# Patient Record
Sex: Male | Born: 1960 | Race: White | Hispanic: No | Marital: Married | Smoking: Current every day smoker
Health system: Southern US, Community
[De-identification: ages and names within clinical notes are randomized; demographics above are authoritative.]

## PROBLEM LIST (undated history)

## (undated) DIAGNOSIS — Z6836 Body mass index (BMI) 36.0-36.9, adult: Secondary | ICD-10-CM

## (undated) DIAGNOSIS — L930 Discoid lupus erythematosus: Secondary | ICD-10-CM

## (undated) DIAGNOSIS — I493 Ventricular premature depolarization: Secondary | ICD-10-CM

## (undated) DIAGNOSIS — G4700 Insomnia, unspecified: Secondary | ICD-10-CM

## (undated) DIAGNOSIS — F419 Anxiety disorder, unspecified: Secondary | ICD-10-CM

## (undated) DIAGNOSIS — M109 Gout, unspecified: Secondary | ICD-10-CM

## (undated) DIAGNOSIS — M1990 Unspecified osteoarthritis, unspecified site: Secondary | ICD-10-CM

## (undated) DIAGNOSIS — E039 Hypothyroidism, unspecified: Secondary | ICD-10-CM

## (undated) DIAGNOSIS — Z87891 Personal history of nicotine dependence: Secondary | ICD-10-CM

## (undated) DIAGNOSIS — G4730 Sleep apnea, unspecified: Secondary | ICD-10-CM

## (undated) DIAGNOSIS — I10 Essential (primary) hypertension: Secondary | ICD-10-CM

## (undated) DIAGNOSIS — E7800 Pure hypercholesterolemia, unspecified: Secondary | ICD-10-CM

## (undated) DIAGNOSIS — R51 Headache: Secondary | ICD-10-CM

## (undated) DIAGNOSIS — E785 Hyperlipidemia, unspecified: Secondary | ICD-10-CM

## (undated) DIAGNOSIS — F329 Major depressive disorder, single episode, unspecified: Secondary | ICD-10-CM

## (undated) DIAGNOSIS — R0602 Shortness of breath: Secondary | ICD-10-CM

## (undated) DIAGNOSIS — E669 Obesity, unspecified: Secondary | ICD-10-CM

## (undated) DIAGNOSIS — R002 Palpitations: Secondary | ICD-10-CM

## (undated) DIAGNOSIS — M539 Dorsopathy, unspecified: Secondary | ICD-10-CM

## (undated) DIAGNOSIS — F32A Depression, unspecified: Secondary | ICD-10-CM

## (undated) DIAGNOSIS — R739 Hyperglycemia, unspecified: Secondary | ICD-10-CM

## (undated) DIAGNOSIS — F418 Other specified anxiety disorders: Secondary | ICD-10-CM

## (undated) DIAGNOSIS — K219 Gastro-esophageal reflux disease without esophagitis: Secondary | ICD-10-CM

## (undated) HISTORY — DX: Personal history of nicotine dependence: Z87.891

## (undated) HISTORY — PX: BRAIN SURGERY: SHX531

## (undated) HISTORY — DX: Hypothyroidism, unspecified: E03.9

## (undated) HISTORY — DX: Dorsopathy, unspecified: M53.9

## (undated) HISTORY — DX: Palpitations: R00.2

## (undated) HISTORY — DX: Other specified anxiety disorders: F41.8

## (undated) HISTORY — DX: Hyperglycemia, unspecified: R73.9

## (undated) HISTORY — DX: Gout, unspecified: M10.9

## (undated) HISTORY — DX: Ventricular premature depolarization: I49.3

## (undated) HISTORY — DX: Hyperlipidemia, unspecified: E78.5

## (undated) HISTORY — DX: Essential (primary) hypertension: I10

## (undated) HISTORY — DX: Discoid lupus erythematosus: L93.0

## (undated) HISTORY — DX: Obesity, unspecified: E66.9

## (undated) HISTORY — PX: BACK SURGERY: SHX140

## (undated) HISTORY — DX: Body mass index (BMI) 36.0-36.9, adult: Z68.36

## (undated) HISTORY — DX: Insomnia, unspecified: G47.00

## (undated) HISTORY — PX: APPENDECTOMY: SHX54

## (undated) HISTORY — PX: NASAL SINUS SURGERY: SHX719

---

## 2001-01-31 ENCOUNTER — Encounter: Payer: Self-pay | Admitting: Neurosurgery

## 2001-02-01 ENCOUNTER — Inpatient Hospital Stay (HOSPITAL_COMMUNITY): Admission: RE | Admit: 2001-02-01 | Discharge: 2001-02-02 | Payer: Self-pay | Admitting: Neurosurgery

## 2001-02-02 ENCOUNTER — Encounter: Payer: Self-pay | Admitting: Neurosurgery

## 2001-04-25 HISTORY — PX: ANKLE ARTHROSCOPY: SHX545

## 2001-04-25 HISTORY — PX: INGUINAL HERNIA REPAIR: SHX194

## 2007-01-05 ENCOUNTER — Encounter: Admission: RE | Admit: 2007-01-05 | Discharge: 2007-01-05 | Payer: Self-pay | Admitting: Neurosurgery

## 2010-09-10 NOTE — Discharge Summary (Signed)
[place]. [facility]  Patient:    SEIF, TEICHERT Visit Number: [id number] MRN: [id number]          Service Type: MED Location: 3000 3034 01 Attending Physician:  Danella Penton Dictated by:   Tanya Nones. Jeral Fruit, M.D. Admit Date:  01/31/2001 Discharge Date: 02/02/2001                             Discharge Summary  ADMISSION DIAGNOSIS:  L5-S1 herniated disk with a foot drop.  FINAL DIAGNOSIS:  L5-S1 herniated disk with a foot drop.  HISTORY OF PRESENT ILLNESS:  The patient was admitted because of foot drop. The patient had a previous surgery in [place], [place].  MRI shows recurrent disk at the level of L4-5.  Surgery was advised.  LABORATORY DATA:  Normal.  HOSPITAL COURSE:  The patient was taken to surgery, and underwent a right L5 hemilaminectomy with a L4-5 diskectomy and foraminotomy at L5-S1 was done. Today he is doing great, and he is ready to go home.  The patient has a shadow in the x-ray of the chest, but a CT scan of the chest was negative.  CONDITION ON DISCHARGE:  Improved.  DISCHARGE MEDICATIONS: 1. Percocet. 2. Diazepam.  DIET:  Regular.  ACTIVITY:  No to drive for at least a week.  FOLLOWUP:  To be seen by me next week. Dictated by:   Tanya Nones. Jeral Fruit, M.D. Attending Physician:  Danella Penton DD:  02/02/01 TD:  02/02/01 Job: [id number]

## 2010-09-10 NOTE — H&P (Signed)
[place]. [facility]  Patient:    CANIO, WINOKUR Visit Number: [id number] MRN: [id number]          Service Type: DSU Location: 3000 3034 01 Attending Physician:  Danella Penton Dictated by:   Tanya Nones. Jeral Fruit, M.D. Admit Date:  01/31/2001                           History and Physical  CHIEF COMPLAINT: Mr. Siebenaler is a patient who was seen by me in my office complaining of back and right leg pain for four weeks.  HISTORY OF PRESENT ILLNESS: Back in 1996 and later on in 1998 he underwent L4-5 and L5-S1 diskectomy on the right side by a neurosurgeon elsewhere.  Now he feels the pain is getting worse and he has more weakness.  He had been taking medication but despite that he is not any better.  He denies any problem with bladder or bowel.  There is no pain into the left leg but sometimes he complains of pain down into the left hip.  Although he lives far away from [place], he was sent to [place] because the neurosurgeon who did his case before was busy and was unable to see him.  He feels he is getting worse and he cannot wait for their appointment.  PAST MEDICAL HISTORY:  1. Lumbar diskectomy in 1996 and 1997.  2. CSF leak corrected in 1998.  3. Sinus surgery.  FAMILY HISTORY: Mother 24 years old with high blood pressure and diabetes. Father died at age 67 with stroke.  SOCIAL HISTORY: He smokes a pack a day.  He does not drink.  REVIEW OF SYSTEMS: Unable to smell.  He has high blood pressure.  He has pain, nonsciatic.  PHYSICAL EXAMINATION:  HEENT: Normal.  NECK: Normal.  LUNGS: Clear.  HEART: Sounds normal.  ABDOMEN: Normal.  EXTREMITIES: Normal pulses.  NEUROLOGIC: Mental status normal.  Cranial nerves normal except for having difficulty smelling.  Strength 5/5 except I can find weakness in the right foot 1/5 and plantar flexion 3/5.  There is no atrophy.  Sensation shows decrease to pinprick on the right side at the level of  L5-S1.  Reflexes are symmetric, absent at right quadriceps and right ankle jerk. Straight-leg-raise on the right side is positive 30 degrees, left side positive at 80 degrees.  LABORATORY DATA: MRI shows he has recurrent herniated disk at the level of L4-5 with ______ and also the possibility of herniated disk at the level of L5-S1.  CLINICAL IMPRESSION: Right L4-5 and L5-S1 herniated disk, recurrent.  PLAN: The patient wants to go ahead with surgery.  The surgery was fully explained to him and it will be a right L5 hemilaminectomy with L4-5 diskectomy and investigation of the 51.  The risks, of course, are no improvement whatsoever, worsening of pain, paralysis, need for further surgery which might require lumbar fusion. Dictated by:   Tanya Nones. Jeral Fruit, M.D. Attending Physician:  Danella Penton DD:  01/31/01 TD:  02/01/01 Job: [phone] [id number]

## 2010-09-10 NOTE — Op Note (Signed)
[place]. [facility]  Patient:    Gordon Roy, Gordon Roy Visit Number: [id number] MRN: [id number]          Service Type: DSU Location: 3000 3034 01 Attending Physician:  Danella Penton Dictated by:   Tanya Nones. Jeral Fruit, M.D. Proc. Date: 01/31/01 Admit Date:  01/31/2001                             Operative Report  PREOPERATIVE DIAGNOSES: 1. Right L4-5 herniated disk, recurrent, with almost foot drop. 2. Right L5-S1 stenosis, rule out herniated disk.  POSTOPERATIVE DIAGNOSES: 1. Right L4-5 herniated disk, recurrent, with almost foot drop. 2. Right L5-S1 stenosis, rule out herniated disk.  PROCEDURES: 1. Right L5 hemilaminectomy. 2. Right L4-5 diskectomy, foraminotomy, with decompression of the L5 nerve    foot. 3. Right L5-S1 foraminotomy, lysis of adhesions. 4. Microscope.  SURGEON:  Tanya Nones. Jeral Fruit, M.D.  CLINICAL HISTORY:  Gordon Roy is a 50 year old gentleman who underwent surgery twice by a neurosurgeon in [place], [place].  In the past few days, he has been complaining of worsening pain associated with almost foot drop in the right foot.  I saw him yesterday and it was decided that he wanted to go ahead with surgery as soon as possible.  The risks were explained in the history and physical.  DESCRIPTION OF PROCEDURE:  The patient was taken to the OR, and he was positioned in a prone manner.  The area was prepped with Betadine.  Previous scar was removed, and we made incision through the skin and muscle.  The muscle was retracted on the right side.  X-rays showed that we were in the spinous process of 4-5.  He has quite a bit of hypertrophy of the facet, which we treated with a drill in the lower lamina of L4 and the hemilaminectomy of L5.  We brought the microscope into the area.  He had quite a bit of scar tissue.  It took [place] at least half an hour to be able to visualize the dural sac.  Finally we were able to see the L5  nerve root, which was almost completely adherent to the floor.  Lysis was accomplished.  We entered the disk space and started doing a total gross diskectomy with removal of degenerative disk.  Then after we had started doing this, we were able to mobilized a little bit more of the L5 nerve root.  Finally mobilization was accomplished and a total gross diskectomy, medially and laterally, was accomplished.  There was some flattening and compromise in the takeoff of L5 nerve root, and removal showed some arachnoid pouch but no areas of any CSF leak.  The L5 nerve root was opened.  Then we investigated the area of L5-S1, and there was no herniated disk at this level but quite a bit of adhesions, and lysis of the S1 nerve root was done.  Then during the procedure we took two x-rays.  A Valsalva maneuver was negative, but nevertheless we used Tisseel along the dura mater.  Then the area was irrigated and the wound was closed with Vicryl and nylon.  The patient did well. Dictated by:   Tanya Nones. Jeral Fruit, M.D. Attending Physician:  Danella Penton DD:  01/31/01 TD:  02/01/01 Job: [id number] [id number]

## 2011-04-14 ENCOUNTER — Other Ambulatory Visit: Payer: Self-pay | Admitting: Neurosurgery

## 2011-04-14 DIAGNOSIS — M549 Dorsalgia, unspecified: Secondary | ICD-10-CM

## 2011-04-15 ENCOUNTER — Other Ambulatory Visit: Payer: Self-pay

## 2011-04-20 ENCOUNTER — Ambulatory Visit
Admission: RE | Admit: 2011-04-20 | Discharge: 2011-04-20 | Disposition: A | Discharge status: Home / Self Care | Payer: BC Managed Care – PPO | Source: Ambulatory Visit | Attending: Neurosurgery | Admitting: Neurosurgery

## 2011-04-20 VITALS — BP 115/67 | HR 87 | Resp 14

## 2011-04-20 DIAGNOSIS — M549 Dorsalgia, unspecified: Secondary | ICD-10-CM

## 2011-04-20 IMAGING — CT CT L SPINE W/ CM
2 of 10 series · 10 of 27 positions shown, 12 images · IV contrast (omnipaque)
Comparison: None.

CLINICAL DATA: Low back and left leg pain and numbness.

 MYELOGRAM INJECTION
TECHNIQUE: Informed consent was obtained from the patient prior to
the procedure, including potential complications of headache,
allergy, infection and pain.  A timeout procedure was performed.
With the patient prone, the lower back was prepped with Betadine.
1% Lidocaine was used for local anesthesia.  Lumbar puncture was
performed at the left L2-3 level using a 22 gauge needle with
return of clear CSF.  15 ml of Omnipaque pain [OL] injected into
the subarachnoid space .
TECHNIQUE: Following injection of intrathecal Omnipaque contrast,
spine imaging in multiple projections was performed using
fluoroscopy.
Fluoroscopy Time: 34 seconds .
TECHNIQUE: CT imaging of the lumbar spine was performed after
intrathecal contrast administration.  Multiplanar CT image
reconstructions were also generated.

[Series 4: thin recons · axial · 0.27mm/px · z∈[-15,+120]mm · 5 of 650 slices shown, 7 images]
[im 109/650  soft-tissue]
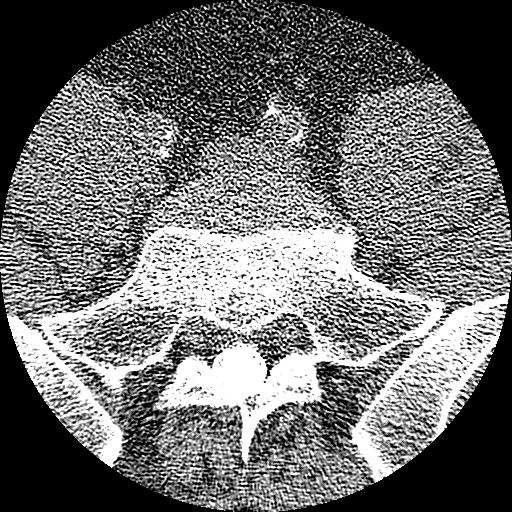
[im 109/650  bone]
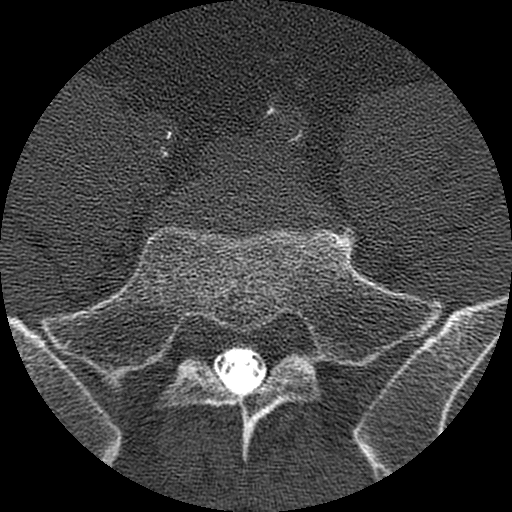
[im 217/650  bone]
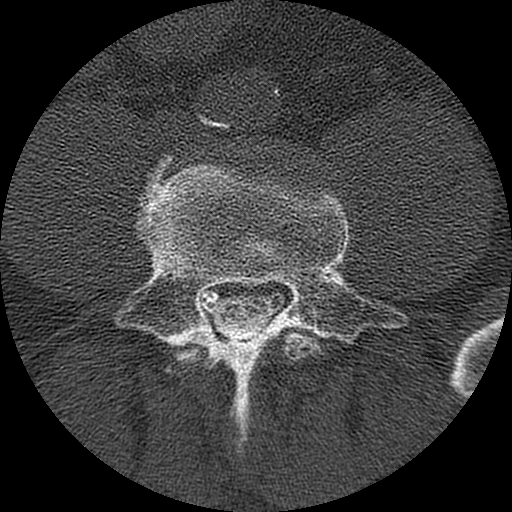
[im 325/650  bone]
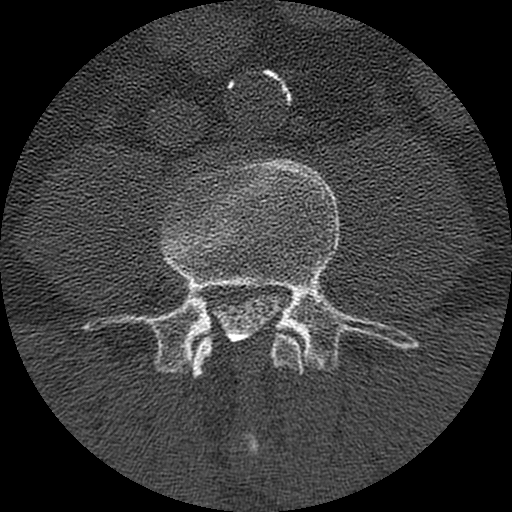
[im 433/650  bone]
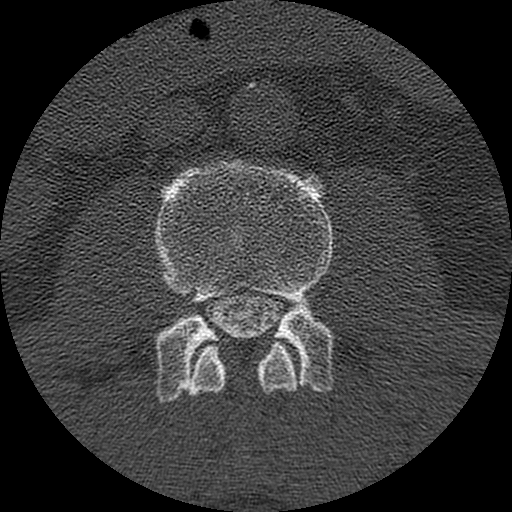
[im 541/650  soft-tissue]
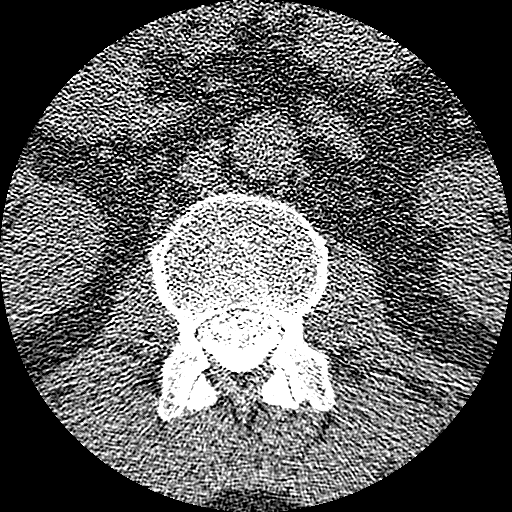
[im 541/650  bone]
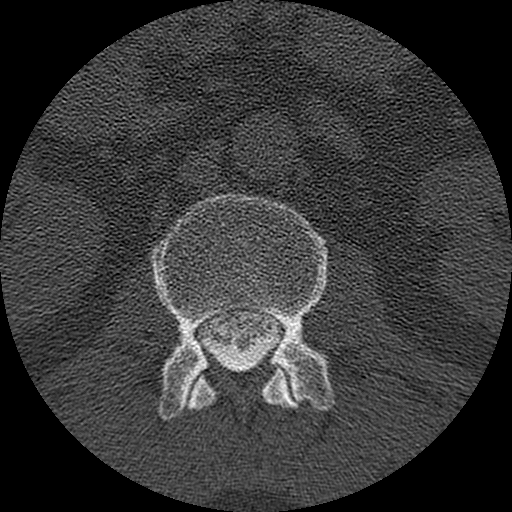

[Series 400: coronal · coronal · 0.41mm/px · 5 of 50 slices shown]
[im 9/50  bone]
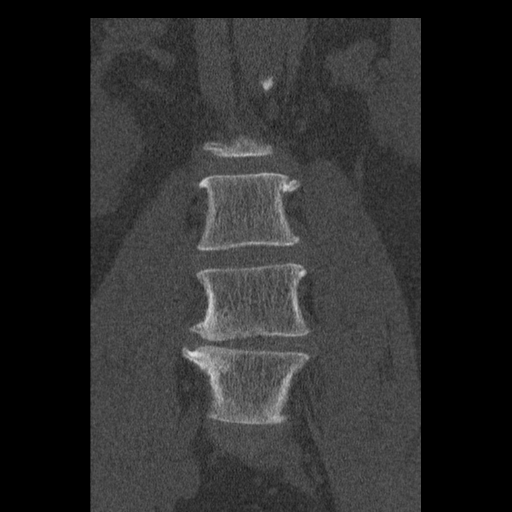
[im 17/50  bone]
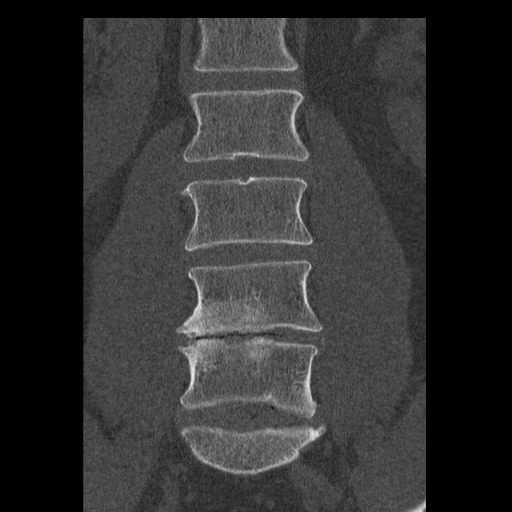
[im 25/50  bone]
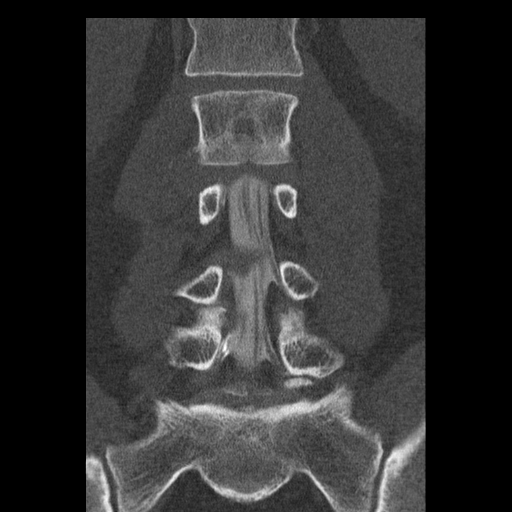
[im 33/50  bone]
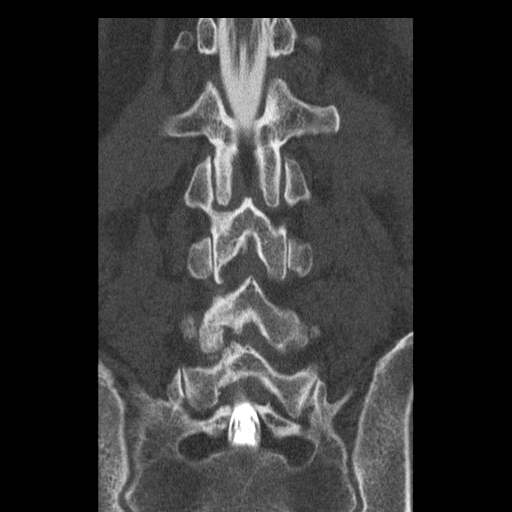
[im 41/50  bone]
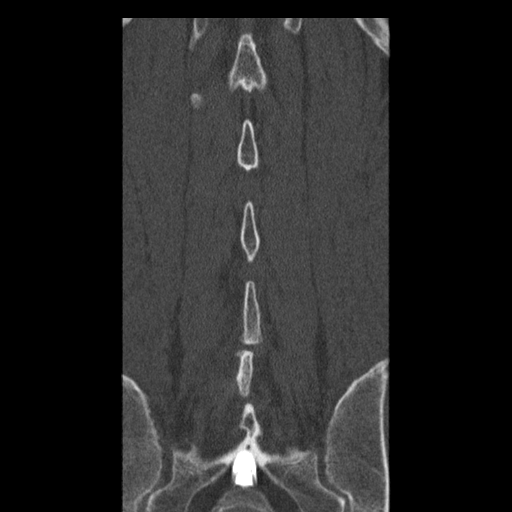

[10 of 27 positions shown; findings below may reference images not displayed]

IMPRESSION: Successful injection of  intrathecal contrast for myelography.

MYELOGRAM LUMBAR
FINDINGS: L1-2 and L2-3:  Unremarkable interspaces by myelography.

L3-4:  Anterior extradural defect.  Slight caudal down turning of
the anterior extradural defect.  Mild right lateral recess
stenosis.

L4-5:  Disc degeneration with loss of disc height.  Mild narrowing
of the right lateral recess without definite neural compression.

L5-S1:  Anterior extradural defect.  Nonfilling of the left S1 root
sleeve.

Standing flexion/extension views do not show any abnormal
subluxation.
IMPRESSION: Nonfilling of the left S1 root sleeve.

L4-5 shows mild lateral recess narrowing not grossly compressive.

L3-4 shows an anterior extradural defect with slight caudal down
turning and mild narrowing of the right lateral recess.

CT MYELOGRAPHY LUMBAR SPINE
FINDINGS: L1-2:  Conus tip at lower L1.  Normal interspace.

L2-3:  Circumferential bulging of the disc with a focal right
foraminal to extra foraminal disc herniation.  This could compress
the right L2 nerve root.

L3-4:  The disc is degenerated with loss of height.  There is
circumferential protrusion of disc material with caudal migration
behind L4 more to the right of midline.  There is been previous
partial right hemilaminectomy.  The L3 nerve roots appear to exit
the foramina without compression.  There is some mass effect upon
the thecal sac on the right without definite neural compression.

L4-5:  The disc is chronically degenerated with vacuum phenomenon
and loss of height.  There is some extruded nitrogen gas which has
migrated up behind L4 in the right lateral recess.  I do not see
any accompanying soft disc material.  There is bilateral facet
degeneration right more than left.  There is foraminal stenosis on
the right because of the vertebral endplate and facet osteophytes.
This could possibly affect the right L4 nerve root.  There is mild
narrowing of the right lateral recess.

L5-S1:  Disc is degenerated with some vacuum phenomenon.  There is
broad-based protrusion of disc material with some caudal down
turning on the left.  There is nonfilling of the left S1 root
sleeve and the structures probably compressed.  There is mild facet
degeneration and mild foraminal narrowing left worse than right.
The left L5 nerve root could be affected as well.
IMPRESSION: L2-3:  Right foraminal to extra foraminal disc herniation that
could possibly affect the right L2 nerve root.

L3-4:  Previous posterior decompression on the right.  Shallow
protrusion of disc material more towards the right with slight
caudal down turning.  No definite neural compression because of the
previous decompression.

L4-5:  Chronic disc degeneration.  Facet degeneration right worse
than left.  Foraminal narrowing on the right that could possibly
affect the L4 nerve root.  Lesser foraminal narrowing on the left.
Mild narrowing of the right lateral recess.

L5-S1:  Broad-based disc herniation with caudal migration on the
left.  Probable left S1 nerve root compression.  Some foraminal
narrowing on the left as well that could possibly affect the L5
nerve root.

## 2011-04-20 IMAGING — CR DG MYELOGRAM LUMBAR
13 of 17 series · 13 of 17 positions shown · IV contrast (omnipaque)
Comparison: None.

CLINICAL DATA: Low back and left leg pain and numbness.

 MYELOGRAM INJECTION
TECHNIQUE: Informed consent was obtained from the patient prior to
the procedure, including potential complications of headache,
allergy, infection and pain.  A timeout procedure was performed.
With the patient prone, the lower back was prepped with Betadine.
1% Lidocaine was used for local anesthesia.  Lumbar puncture was
performed at the left L2-3 level using a 22 gauge needle with
return of clear CSF.  15 ml of Omnipaque pain [OL] injected into
the subarachnoid space .
TECHNIQUE: Following injection of intrathecal Omnipaque contrast,
spine imaging in multiple projections was performed using
fluoroscopy.
Fluoroscopy Time: 34 seconds .
TECHNIQUE: CT imaging of the lumbar spine was performed after
intrathecal contrast administration.  Multiplanar CT image
reconstructions were also generated.

[[hospital]]
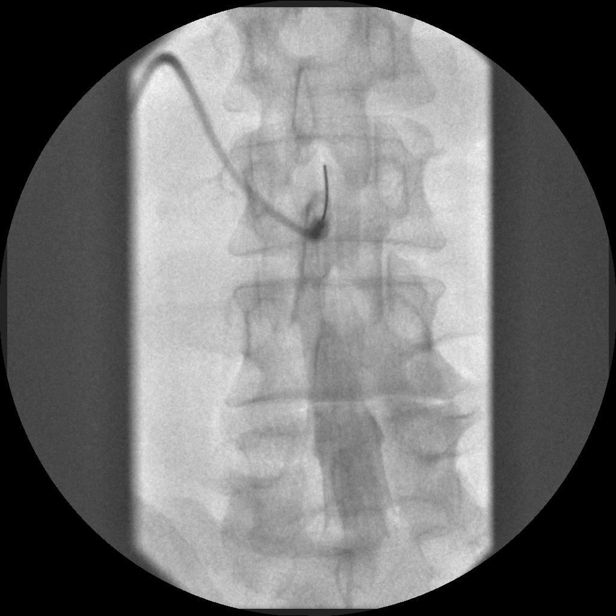

[myelogram  white (1 of 10)]
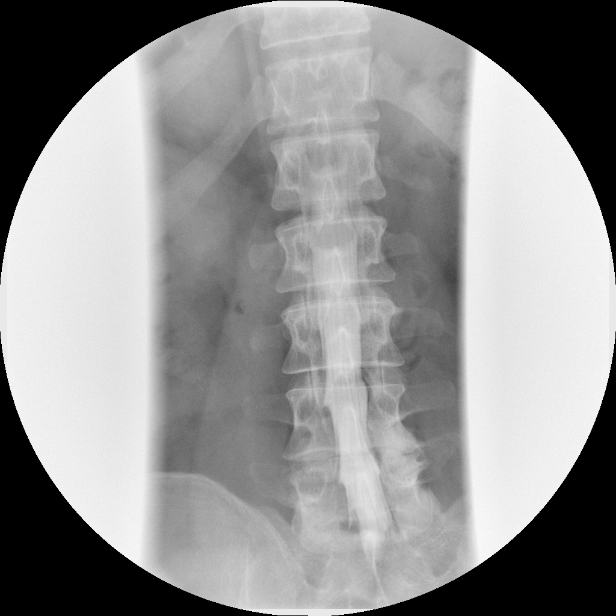

[myelogram  white (2 of 10)]
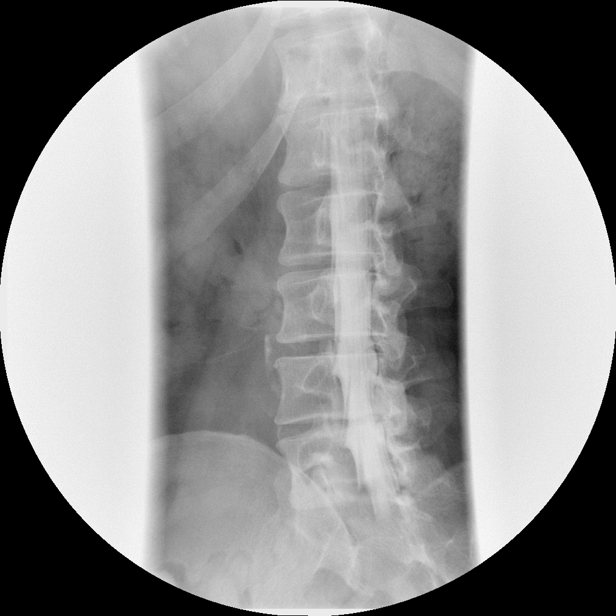

[myelogram  white (3 of 10)]
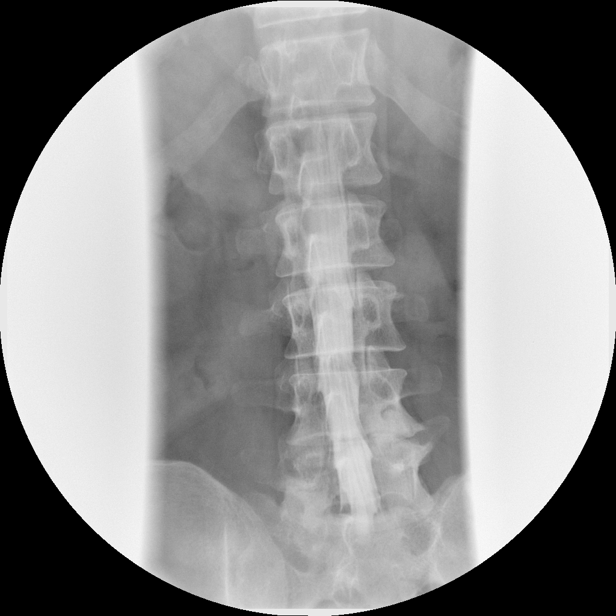

[myelogram  white (4 of 10)]
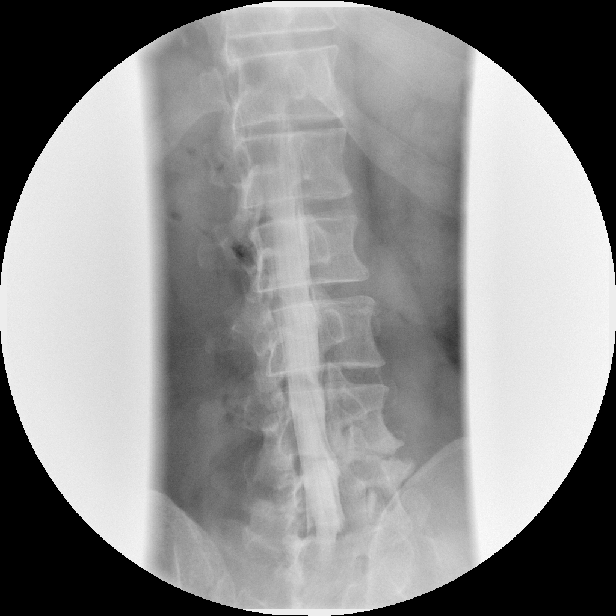

[myelogram  white (5 of 10)]
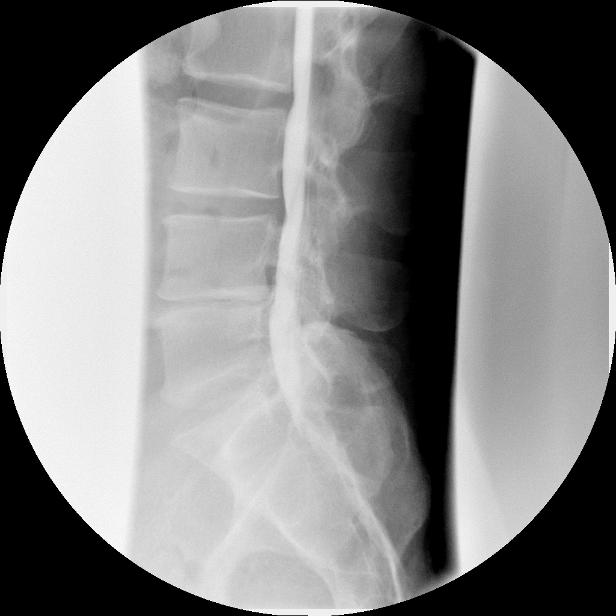

[myelogram  white (6 of 10)]
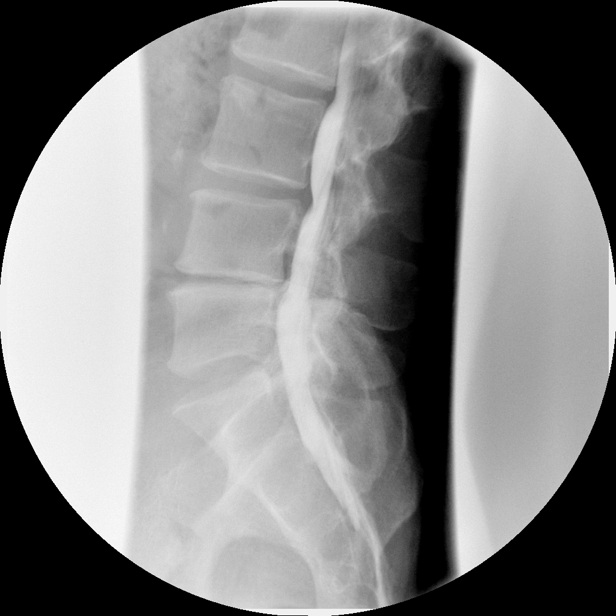

[myelogram  white (7 of 10)]
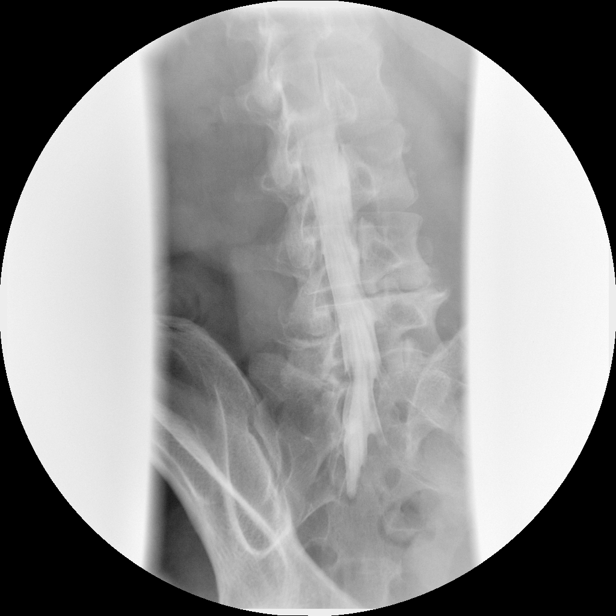

[myelogram  white (8 of 10)]
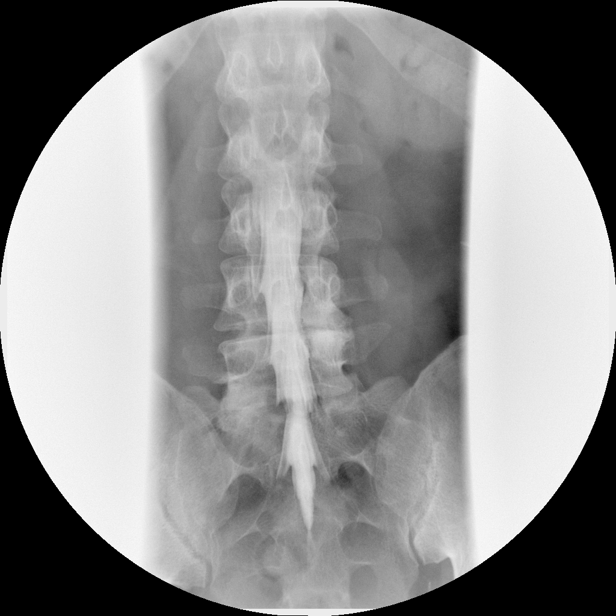

[myelogram  white (9 of 10)]
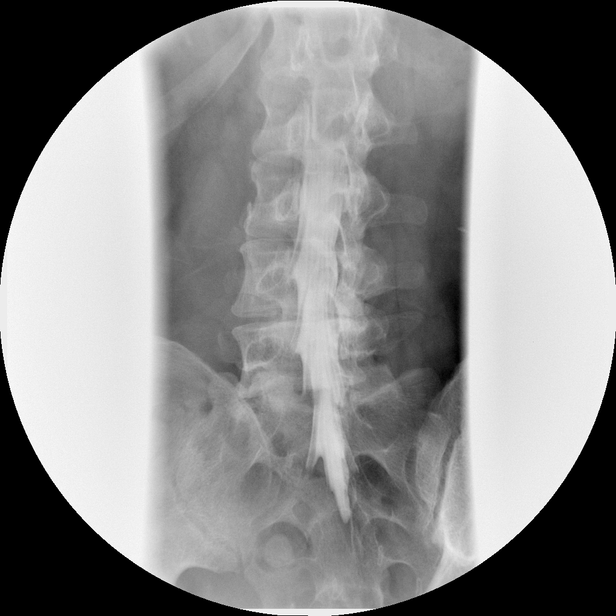

[myelogram  white (10 of 10)]
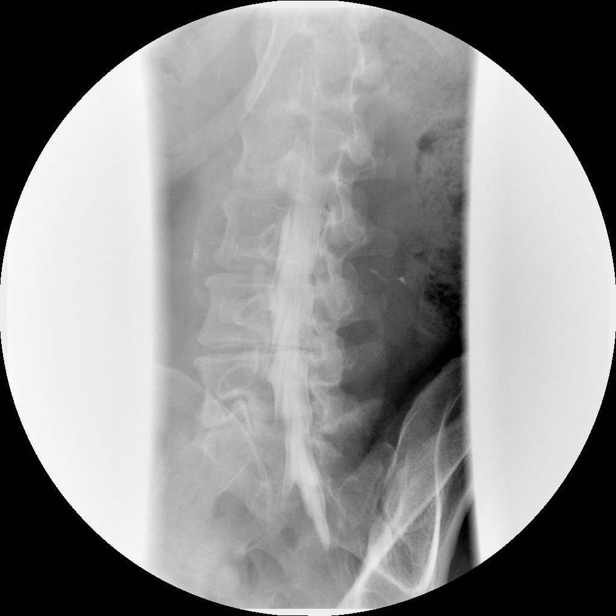

[view not recorded (1 of 2)]
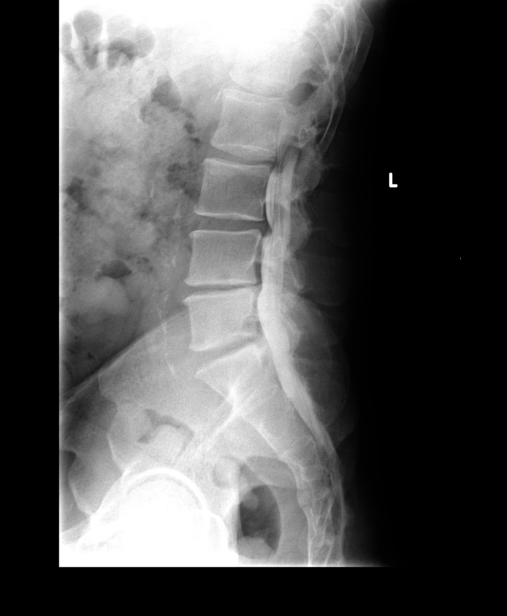

[view not recorded (2 of 2)]
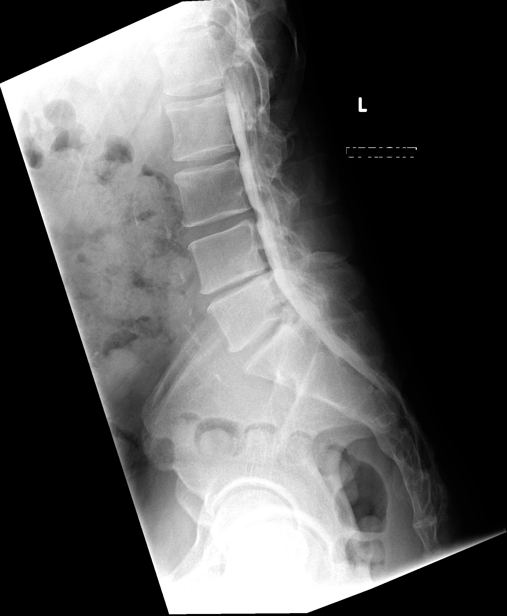

[13 of 17 positions shown; findings below may reference images not displayed]

IMPRESSION: Successful injection of  intrathecal contrast for myelography.

MYELOGRAM LUMBAR
FINDINGS: L1-2 and L2-3:  Unremarkable interspaces by myelography.

L3-4:  Anterior extradural defect.  Slight caudal down turning of
the anterior extradural defect.  Mild right lateral recess
stenosis.

L4-5:  Disc degeneration with loss of disc height.  Mild narrowing
of the right lateral recess without definite neural compression.

L5-S1:  Anterior extradural defect.  Nonfilling of the left S1 root
sleeve.

Standing flexion/extension views do not show any abnormal
subluxation.
IMPRESSION: Nonfilling of the left S1 root sleeve.

L4-5 shows mild lateral recess narrowing not grossly compressive.

L3-4 shows an anterior extradural defect with slight caudal down
turning and mild narrowing of the right lateral recess.

CT MYELOGRAPHY LUMBAR SPINE
FINDINGS: L1-2:  Conus tip at lower L1.  Normal interspace.

L2-3:  Circumferential bulging of the disc with a focal right
foraminal to extra foraminal disc herniation.  This could compress
the right L2 nerve root.

L3-4:  The disc is degenerated with loss of height.  There is
circumferential protrusion of disc material with caudal migration
behind L4 more to the right of midline.  There is been previous
partial right hemilaminectomy.  The L3 nerve roots appear to exit
the foramina without compression.  There is some mass effect upon
the thecal sac on the right without definite neural compression.

L4-5:  The disc is chronically degenerated with vacuum phenomenon
and loss of height.  There is some extruded nitrogen gas which has
migrated up behind L4 in the right lateral recess.  I do not see
any accompanying soft disc material.  There is bilateral facet
degeneration right more than left.  There is foraminal stenosis on
the right because of the vertebral endplate and facet osteophytes.
This could possibly affect the right L4 nerve root.  There is mild
narrowing of the right lateral recess.

L5-S1:  Disc is degenerated with some vacuum phenomenon.  There is
broad-based protrusion of disc material with some caudal down
turning on the left.  There is nonfilling of the left S1 root
sleeve and the structures probably compressed.  There is mild facet
degeneration and mild foraminal narrowing left worse than right.
The left L5 nerve root could be affected as well.
IMPRESSION: L2-3:  Right foraminal to extra foraminal disc herniation that
could possibly affect the right L2 nerve root.

L3-4:  Previous posterior decompression on the right.  Shallow
protrusion of disc material more towards the right with slight
caudal down turning.  No definite neural compression because of the
previous decompression.

L4-5:  Chronic disc degeneration.  Facet degeneration right worse
than left.  Foraminal narrowing on the right that could possibly
affect the L4 nerve root.  Lesser foraminal narrowing on the left.
Mild narrowing of the right lateral recess.

L5-S1:  Broad-based disc herniation with caudal migration on the
left.  Probable left S1 nerve root compression.  Some foraminal
narrowing on the left as well that could possibly affect the L5
nerve root.

## 2011-04-20 MED ORDER — DIAZEPAM 5 MG PO TABS: 10.0000 mg | ORAL_TABLET | Freq: Once | ORAL | Status: DC

## 2011-04-20 MED ORDER — MEPERIDINE HCL 50 MG/ML IJ SOLN: 50.0000 mg | Freq: Once | INTRAMUSCULAR | Status: DC

## 2011-04-20 MED ORDER — IOHEXOL 180 MG/ML  SOLN
15.0000 mL | Freq: Once | INTRAMUSCULAR | Status: AC | PRN
  Administered 2011-04-20: 15 mL via INTRATHECAL

## 2011-04-20 MED ORDER — ONDANSETRON HCL 4 MG/2ML IJ SOLN: 4.0000 mg | Freq: Once | INTRAMUSCULAR | Status: DC

## 2011-04-20 NOTE — Progress Notes (Signed)
9629  Patient c/o back pain.  Given Rx.  Taking po liquids, tolerated well.  0945  Napping.    1045  Reviewed discharge instructions w/ patient & patient's wife.  States that pain has eased considerably.  Up to [facility] w/ assistance.  Gait steady.  1050  Pt discharged to home.  Wife to drive.

## 2011-04-21 ENCOUNTER — Other Ambulatory Visit: Payer: Self-pay | Admitting: Neurosurgery

## 2011-04-21 ENCOUNTER — Encounter (HOSPITAL_COMMUNITY): Payer: Self-pay | Admitting: Pharmacy Technician

## 2011-04-21 ENCOUNTER — Encounter (HOSPITAL_COMMUNITY): Payer: Self-pay

## 2011-04-21 MED ORDER — CEFAZOLIN SODIUM 1-5 GM-% IV SOLN: 1.0000 g | INTRAVENOUS | Status: DC

## 2011-04-22 ENCOUNTER — Ambulatory Visit (HOSPITAL_COMMUNITY): Payer: BC Managed Care – PPO

## 2011-04-22 ENCOUNTER — Encounter (HOSPITAL_COMMUNITY): Payer: Self-pay | Admitting: General Practice

## 2011-04-22 ENCOUNTER — Encounter (HOSPITAL_COMMUNITY)
Admission: RE | Disposition: A | Discharge status: Home / Self Care | Payer: Self-pay | Source: Ambulatory Visit | Attending: Neurosurgery

## 2011-04-22 ENCOUNTER — Encounter (HOSPITAL_COMMUNITY): Payer: Self-pay | Admitting: *Deleted

## 2011-04-22 ENCOUNTER — Encounter (HOSPITAL_COMMUNITY): Payer: Self-pay | Admitting: Anesthesiology

## 2011-04-22 ENCOUNTER — Inpatient Hospital Stay (HOSPITAL_COMMUNITY)
Admission: RE | Admit: 2011-04-22 | Discharge: 2011-04-23 | DRG: 758 | Disposition: A | Discharge status: Home / Self Care | Payer: BC Managed Care – PPO | Source: Ambulatory Visit | Attending: Neurosurgery | Admitting: Neurosurgery

## 2011-04-22 ENCOUNTER — Other Ambulatory Visit: Payer: Self-pay

## 2011-04-22 ENCOUNTER — Ambulatory Visit (HOSPITAL_COMMUNITY): Payer: BC Managed Care – PPO | Admitting: Anesthesiology

## 2011-04-22 DIAGNOSIS — M5126 Other intervertebral disc displacement, lumbar region: Principal | ICD-10-CM | POA: Diagnosis present

## 2011-04-22 DIAGNOSIS — F3289 Other specified depressive episodes: Secondary | ICD-10-CM | POA: Diagnosis present

## 2011-04-22 DIAGNOSIS — F329 Major depressive disorder, single episode, unspecified: Secondary | ICD-10-CM | POA: Diagnosis present

## 2011-04-22 DIAGNOSIS — I10 Essential (primary) hypertension: Secondary | ICD-10-CM | POA: Diagnosis present

## 2011-04-22 DIAGNOSIS — Z01810 Encounter for preprocedural cardiovascular examination: Secondary | ICD-10-CM

## 2011-04-22 DIAGNOSIS — K219 Gastro-esophageal reflux disease without esophagitis: Secondary | ICD-10-CM | POA: Diagnosis present

## 2011-04-22 DIAGNOSIS — Z01811 Encounter for preprocedural respiratory examination: Secondary | ICD-10-CM

## 2011-04-22 DIAGNOSIS — F411 Generalized anxiety disorder: Secondary | ICD-10-CM | POA: Diagnosis present

## 2011-04-22 HISTORY — DX: Sleep apnea, unspecified: G47.30

## 2011-04-22 HISTORY — DX: Shortness of breath: R06.02

## 2011-04-22 HISTORY — DX: Pure hypercholesterolemia, unspecified: E78.00

## 2011-04-22 HISTORY — DX: Unspecified osteoarthritis, unspecified site: M19.90

## 2011-04-22 HISTORY — DX: Gastro-esophageal reflux disease without esophagitis: K21.9

## 2011-04-22 HISTORY — DX: Depression, unspecified: F32.A

## 2011-04-22 HISTORY — DX: Anxiety disorder, unspecified: F41.9

## 2011-04-22 HISTORY — DX: Major depressive disorder, single episode, unspecified: F32.9

## 2011-04-22 HISTORY — DX: Headache: R51

## 2011-04-22 HISTORY — PX: LUMBAR LAMINECTOMY/DECOMPRESSION MICRODISCECTOMY: SHX5026

## 2011-04-22 HISTORY — PX: LUMBAR MICRODISCECTOMY: SHX99

## 2011-04-22 HISTORY — DX: Essential (primary) hypertension: I10

## 2011-04-22 LAB — BASIC METABOLIC PANEL
Calcium: 10.6 mg/dL — ABNORMAL HIGH (ref 8.4–10.5)
GFR calc non Af Amer: >90 mL/min (ref >90)
Glucose, Bld: 106 mg/dL — ABNORMAL HIGH (ref 70–99)
Sodium: 140 mEq/L (ref 135–145)

## 2011-04-22 LAB — CBC
MCH: 32.4 pg (ref 26.0–34.0)
Platelets: 277 10*3/uL (ref 150–400)
RBC: 5.22 MIL/uL (ref 4.22–5.81)
RDW: 12.9 % (ref 11.5–15.5)
WBC: 12.8 10*3/uL — ABNORMAL HIGH (ref 4.0–10.5)

## 2011-04-22 LAB — SURGICAL PCR SCREEN: MRSA, PCR: NEGATIVE

## 2011-04-22 IMAGING — CR DG LUMBAR SPINE 1V
1 series · 1 of 1 positions shown · non-contrast
Comparison: Lumbar myelogram CT [DATE].

CLINICAL DATA: Left L5-S1 laminectomy.

LUMBAR SPINE - 1 VIEW

[view not recorded]
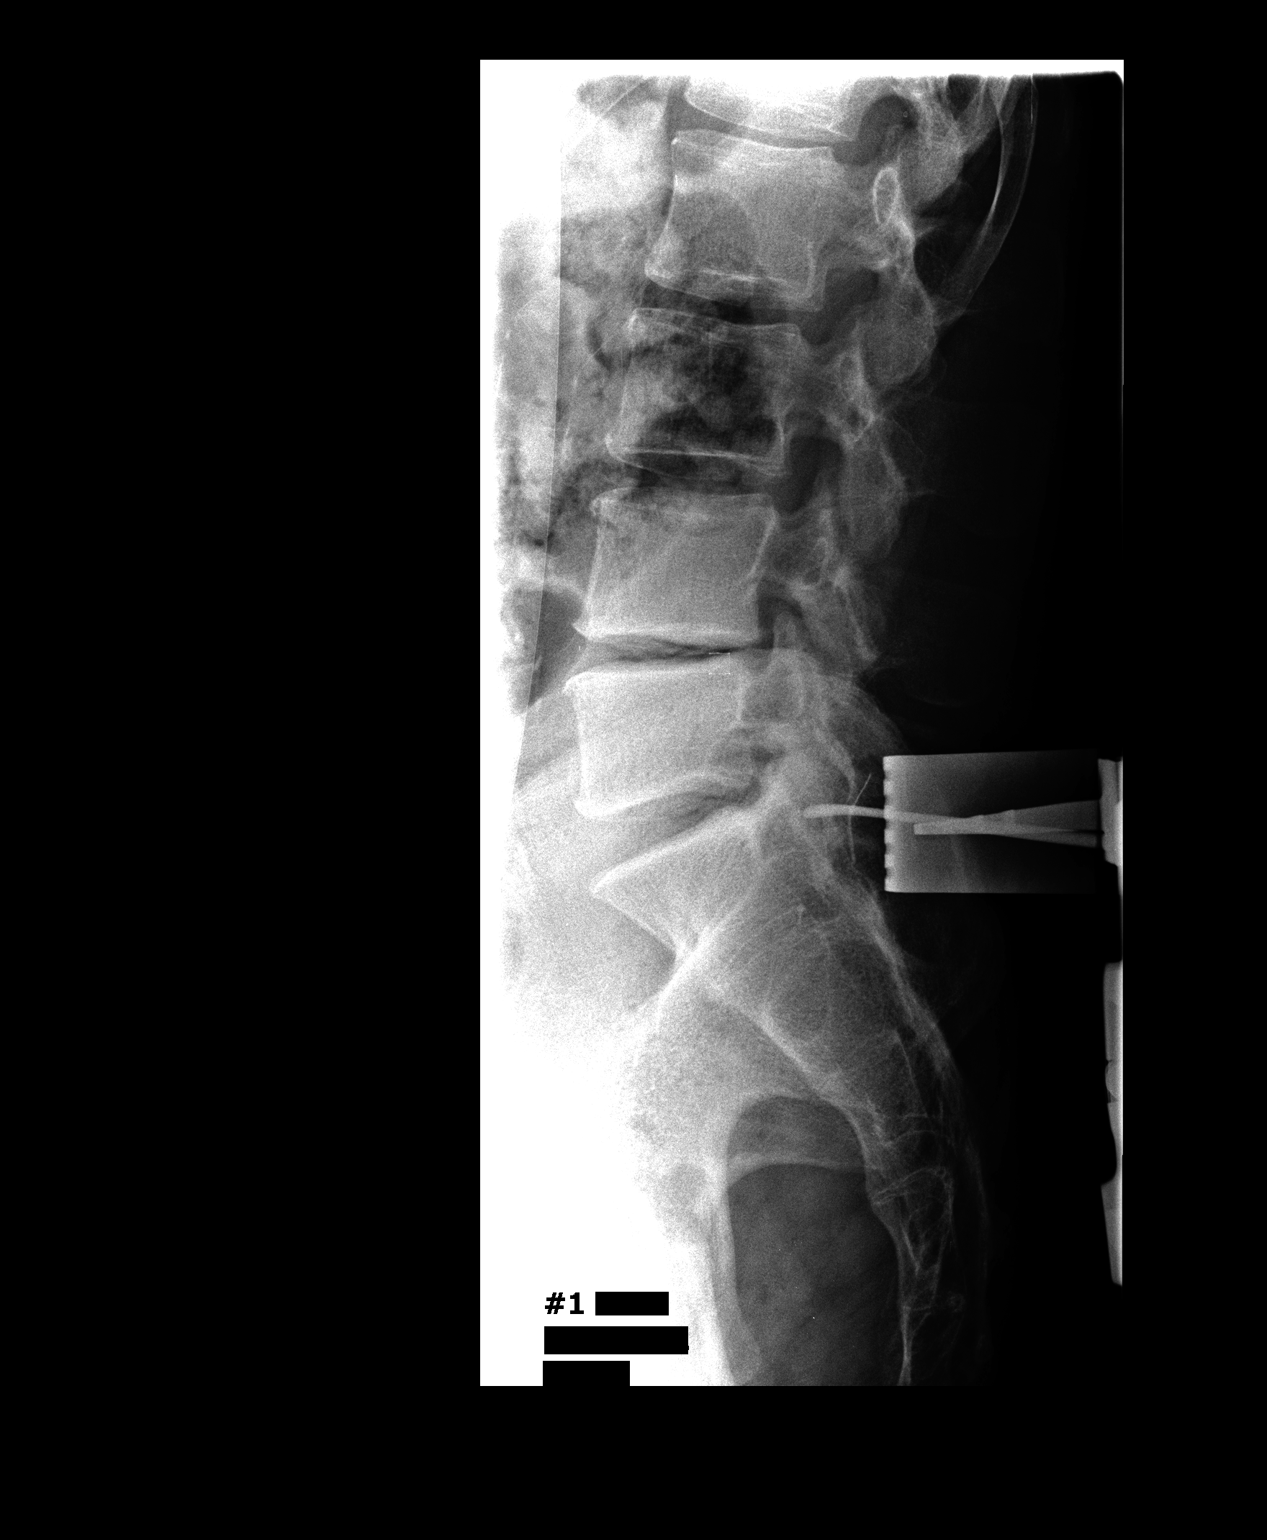

[1 of 1 positions shown; findings below may reference images not displayed]

FINDINGS: Cross-table lateral view of the lumbar spine at [O7]
hours demonstrates skin spreaders posteriorly at L5-S1.  A blunt
surgical instrument overlies the L5-S1 facet joint.
IMPRESSION: Lumbar localization as described.

## 2011-04-22 IMAGING — CR DG CHEST 2V
2 series · 2 of 2 positions shown · non-contrast
Comparison: [DATE].

CLINICAL DATA: Preoperative respiratory examination for lumbar
laminectomy.

CHEST - 2 VIEW

[view not recorded (1 of 2)]
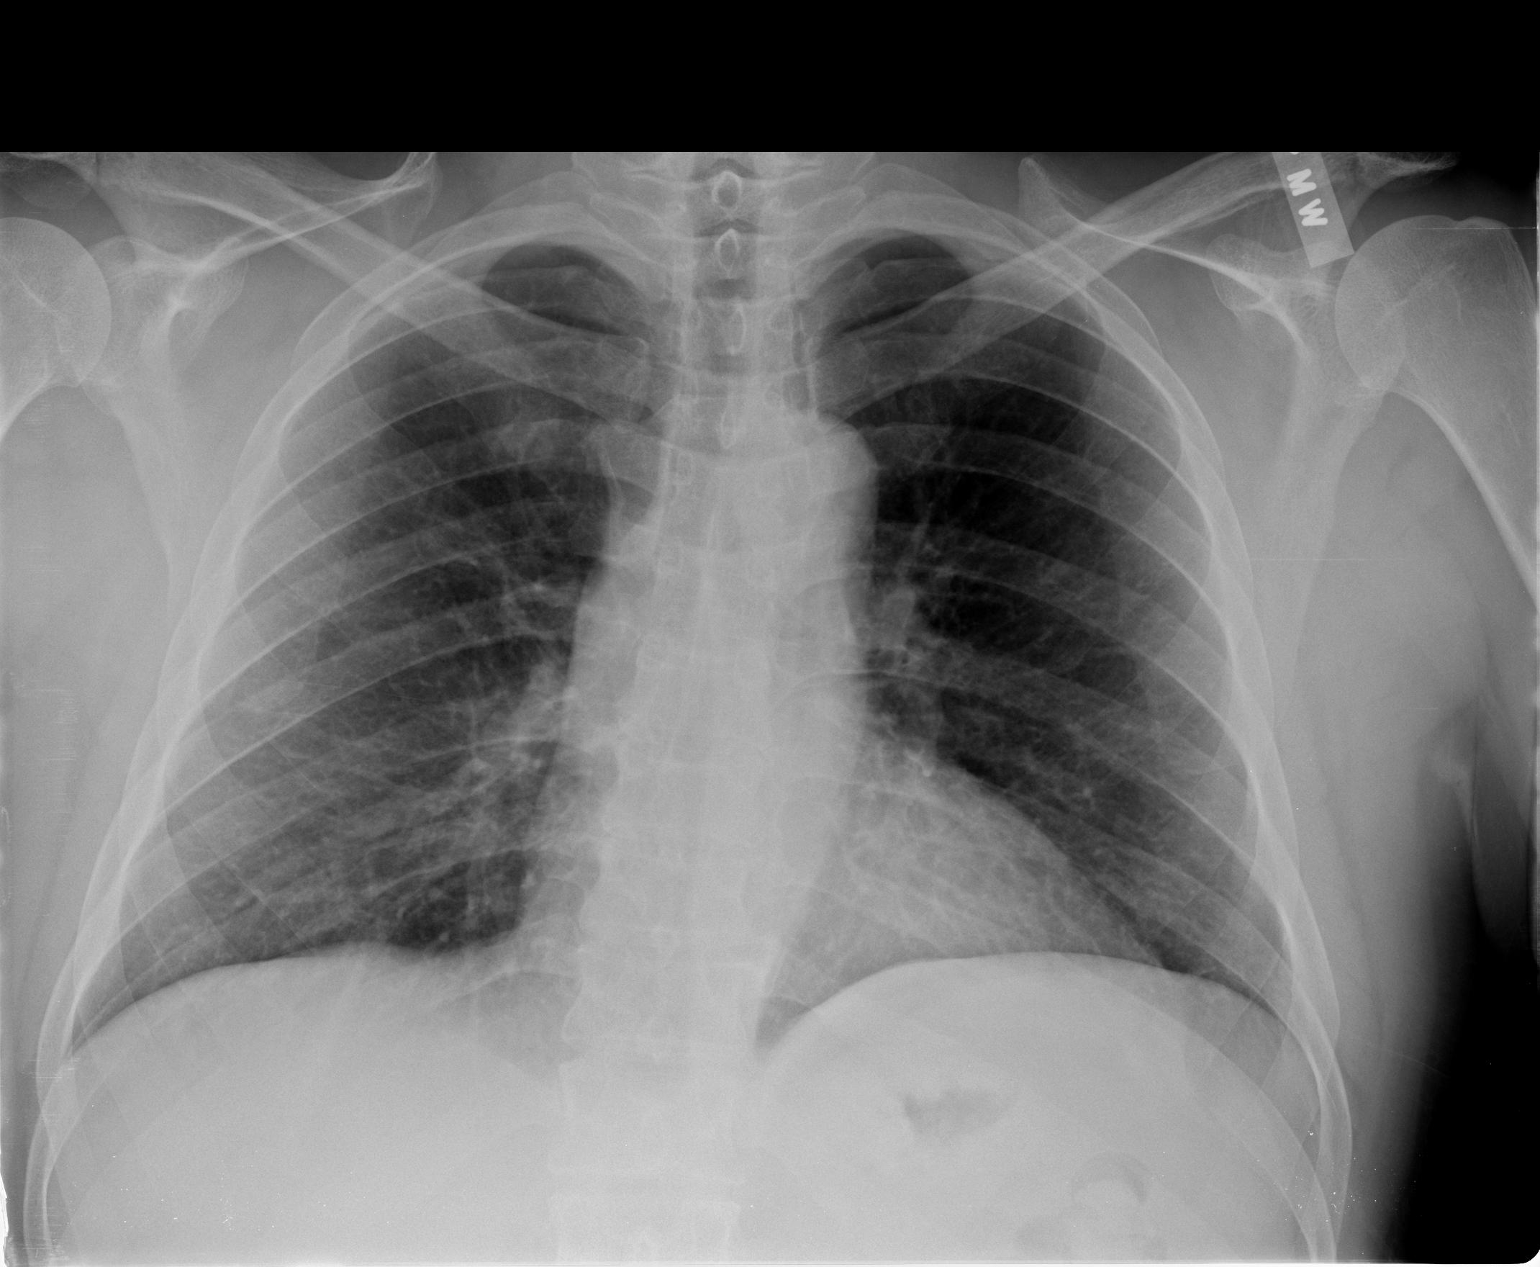

[view not recorded (2 of 2)]
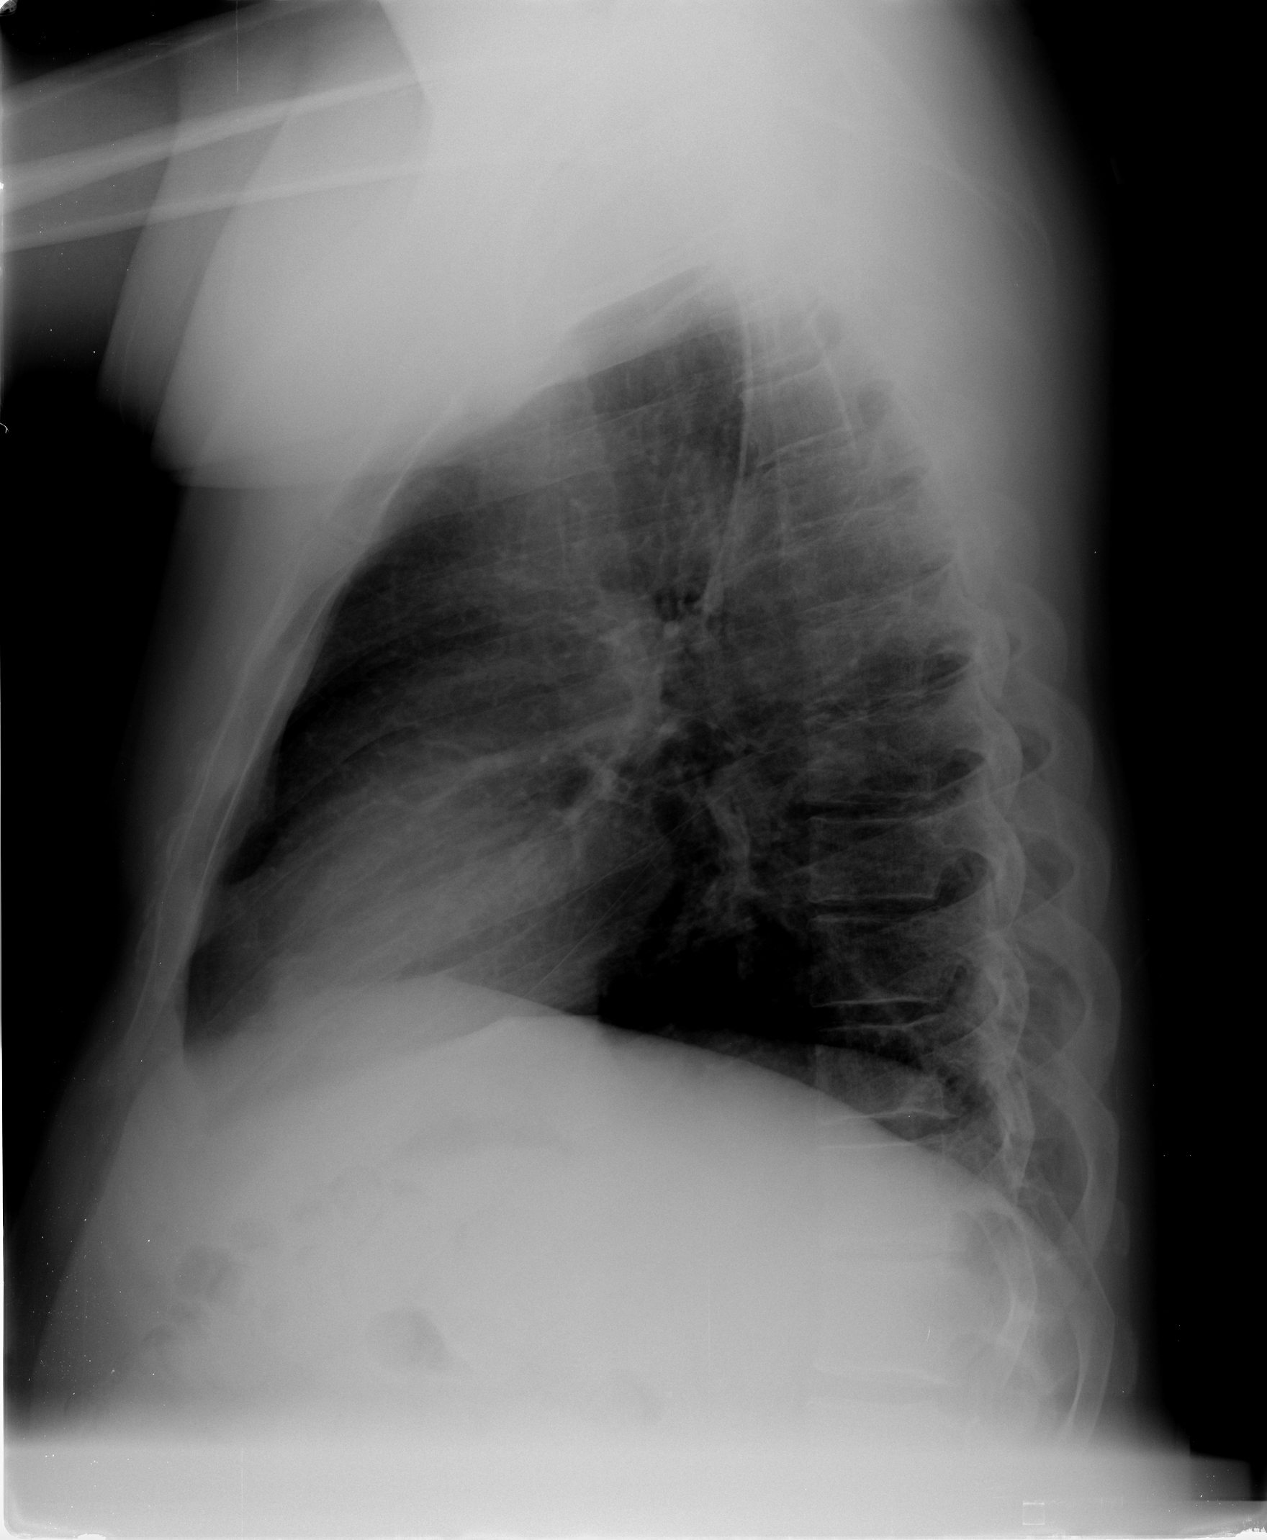

[2 of 2 positions shown; findings below may reference images not displayed]

FINDINGS: The heart size and mediastinal contours are stable.  Mild
interstitial prominence appears chronic.  There is no edema,
confluent airspace opacity or pleural effusion.  Osseous structures
appear unchanged.
IMPRESSION: Stable examination.  No active cardiopulmonary process.

## 2011-04-22 SURGERY — LUMBAR LAMINECTOMY/DECOMPRESSION MICRODISCECTOMY
Anesthesia: General | Site: Back | Laterality: Left | Wound class: Clean

## 2011-04-22 MED ORDER — CEFAZOLIN SODIUM 1-5 GM-% IV SOLN
1.0000 g | Freq: Once | INTRAVENOUS | Status: AC
  Administered 2011-04-22: 1 g via INTRAVENOUS
  Filled 2011-04-22: qty 50

## 2011-04-22 MED ORDER — MEPERIDINE HCL 25 MG/ML IJ SOLN: 6.2500 mg | INTRAMUSCULAR | Status: DC | PRN

## 2011-04-22 MED ORDER — SODIUM CHLORIDE 0.9 % IJ SOLN: 3.0000 mL | INTRAMUSCULAR | Status: DC | PRN

## 2011-04-22 MED ORDER — HYDROMORPHONE HCL PF 1 MG/ML IJ SOLN
0.2500 mg | INTRAMUSCULAR | Status: DC | PRN
  Administered 2011-04-22: 0.5 mg via INTRAVENOUS

## 2011-04-22 MED ORDER — HYDROMORPHONE HCL PF 1 MG/ML IJ SOLN: 1.0000 mg | INTRAMUSCULAR | Status: DC | PRN

## 2011-04-22 MED ORDER — DIAZEPAM 5 MG PO TABS
5.0000 mg | ORAL_TABLET | Freq: Four times a day (QID) | ORAL | Status: DC | PRN
  Administered 2011-04-22 – 2011-04-23 (×2): 5 mg via ORAL
  Filled 2011-04-22 (×2): qty 1

## 2011-04-22 MED ORDER — FENTANYL CITRATE 0.05 MG/ML IJ SOLN
INTRAMUSCULAR | Status: AC
  Filled 2011-04-22: qty 2

## 2011-04-22 MED ORDER — CEFAZOLIN SODIUM-DEXTROSE 2-3 GM-% IV SOLR
INTRAVENOUS | Status: AC
  Administered 2011-04-22: 2 g via INTRAVENOUS
  Filled 2011-04-22: qty 50

## 2011-04-22 MED ORDER — ROCURONIUM BROMIDE 100 MG/10ML IV SOLN
INTRAVENOUS | Status: DC | PRN
  Administered 2011-04-22: 50 mg via INTRAVENOUS

## 2011-04-22 MED ORDER — HEMOSTATIC AGENTS (NO CHARGE) OPTIME
TOPICAL | Status: DC | PRN
  Administered 2011-04-22: 1 via TOPICAL

## 2011-04-22 MED ORDER — SODIUM CHLORIDE 0.9 % IV SOLN: 250.0000 mL | INTRAVENOUS | Status: DC

## 2011-04-22 MED ORDER — PROMETHAZINE HCL 25 MG/ML IJ SOLN: 6.2500 mg | INTRAMUSCULAR | Status: DC | PRN

## 2011-04-22 MED ORDER — 0.9 % SODIUM CHLORIDE (POUR BTL) OPTIME
TOPICAL | Status: DC | PRN
  Administered 2011-04-22: 1000 mL

## 2011-04-22 MED ORDER — CYCLOBENZAPRINE HCL 10 MG PO TABS: 10.0000 mg | ORAL_TABLET | Freq: Three times a day (TID) | ORAL | Status: DC | PRN

## 2011-04-22 MED ORDER — PNEUMOCOCCAL VAC POLYVALENT 25 MCG/0.5ML IJ INJ
0.5000 mL | INJECTION | INTRAMUSCULAR | Status: AC
Start: 2011-04-23 — End: 2011-04-23
  Administered 2011-04-23: 0.5 mL via INTRAMUSCULAR
  Filled 2011-04-22: qty 0.5

## 2011-04-22 MED ORDER — NEOSTIGMINE METHYLSULFATE 1 MG/ML IJ SOLN
INTRAMUSCULAR | Status: DC | PRN
  Administered 2011-04-22: 3 mg via INTRAVENOUS

## 2011-04-22 MED ORDER — METHYLPREDNISOLONE ACETATE PF 80 MG/ML IJ SUSP
INTRAMUSCULAR | Status: DC | PRN
  Administered 2011-04-22: 80 mg

## 2011-04-22 MED ORDER — FENTANYL CITRATE 0.05 MG/ML IJ SOLN
INTRAMUSCULAR | Status: DC | PRN
  Administered 2011-04-22: 100 ug via INTRAVENOUS

## 2011-04-22 MED ORDER — MENTHOL 3 MG MT LOZG: 1.0000 | LOZENGE | OROMUCOSAL | Status: DC | PRN

## 2011-04-22 MED ORDER — GLYCOPYRROLATE 0.2 MG/ML IJ SOLN
INTRAMUSCULAR | Status: DC | PRN
  Administered 2011-04-22: .4 mg via INTRAVENOUS

## 2011-04-22 MED ORDER — LACTATED RINGERS IV SOLN
INTRAVENOUS | Status: DC | PRN
  Administered 2011-04-22 (×2): via INTRAVENOUS

## 2011-04-22 MED ORDER — OXYCODONE-ACETAMINOPHEN 5-325 MG PO TABS
1.0000 | ORAL_TABLET | ORAL | Status: DC | PRN
  Administered 2011-04-22 – 2011-04-23 (×3): 2 via ORAL
  Filled 2011-04-22 (×3): qty 2

## 2011-04-22 MED ORDER — ZOLPIDEM TARTRATE 10 MG PO TABS: 10.0000 mg | ORAL_TABLET | Freq: Every evening | ORAL | Status: DC | PRN

## 2011-04-22 MED ORDER — SODIUM CHLORIDE 0.9 % IJ SOLN
3.0000 mL | Freq: Two times a day (BID) | INTRAMUSCULAR | Status: DC
  Administered 2011-04-22: 3 mL via INTRAVENOUS

## 2011-04-22 MED ORDER — ACETAMINOPHEN 650 MG RE SUPP: 650.0000 mg | RECTAL | Status: DC | PRN

## 2011-04-22 MED ORDER — LACTATED RINGERS IV SOLN: INTRAVENOUS | Status: DC

## 2011-04-22 MED ORDER — PHENOL 1.4 % MT LIQD: 1.0000 | OROMUCOSAL | Status: DC | PRN

## 2011-04-22 MED ORDER — HYDROMORPHONE HCL PF 1 MG/ML IJ SOLN
INTRAMUSCULAR | Status: AC
  Administered 2011-04-23: 2 mg via INTRAVENOUS
  Filled 2011-04-22: qty 1

## 2011-04-22 MED ORDER — ACETAMINOPHEN 325 MG PO TABS: 650.0000 mg | ORAL_TABLET | ORAL | Status: DC | PRN

## 2011-04-22 MED ORDER — BUPIVACAINE LIPOSOME 1.3 % IJ SUSP
20.0000 mL | INTRAMUSCULAR | Status: AC
  Administered 2011-04-22: 20 mL
  Filled 2011-04-22: qty 20

## 2011-04-22 MED ORDER — OXYCODONE HCL 5 MG PO TABS
15.0000 mg | ORAL_TABLET | Freq: Once | ORAL | Status: AC
  Administered 2011-04-22: 15 mg via ORAL

## 2011-04-22 MED ORDER — MUPIROCIN 2 % EX OINT
TOPICAL_OINTMENT | CUTANEOUS | Status: AC
  Filled 2011-04-22: qty 22

## 2011-04-22 MED ORDER — ONDANSETRON HCL 4 MG/2ML IJ SOLN: 4.0000 mg | INTRAMUSCULAR | Status: DC | PRN

## 2011-04-22 MED ORDER — HYDROMORPHONE HCL PF 1 MG/ML IJ SOLN
1.0000 mg | INTRAMUSCULAR | Status: DC | PRN
  Administered 2011-04-22 – 2011-04-23 (×3): 2 mg via INTRAVENOUS
  Filled 2011-04-22 (×3): qty 2

## 2011-04-22 MED ORDER — LIDOCAINE HCL (CARDIAC) 20 MG/ML IV SOLN
INTRAVENOUS | Status: DC | PRN
  Administered 2011-04-22: 60 mg via INTRAVENOUS

## 2011-04-22 MED ORDER — AMLODIPINE BESYLATE 10 MG PO TABS
10.0000 mg | ORAL_TABLET | ORAL | Status: DC
  Administered 2011-04-23: 10 mg via ORAL
  Filled 2011-04-22 (×2): qty 1

## 2011-04-22 MED ORDER — THROMBIN 5000 UNITS EX KIT
PACK | CUTANEOUS | Status: DC | PRN
  Administered 2011-04-22 (×2): 5000 [IU] via TOPICAL

## 2011-04-22 MED ORDER — OXYCODONE HCL 5 MG PO TABS
ORAL_TABLET | ORAL | Status: AC
  Administered 2011-04-22: 15 mg via ORAL
  Filled 2011-04-22: qty 3

## 2011-04-22 MED ORDER — ONDANSETRON HCL 4 MG/2ML IJ SOLN
INTRAMUSCULAR | Status: DC | PRN
  Administered 2011-04-22: 4 mg via INTRAVENOUS

## 2011-04-22 MED ORDER — FENTANYL CITRATE 0.05 MG/ML IJ SOLN
INTRAMUSCULAR | Status: DC | PRN
  Administered 2011-04-22 (×5): 50 ug via INTRAVENOUS
  Administered 2011-04-22: 100 ug via INTRAVENOUS

## 2011-04-22 MED ORDER — MIDAZOLAM HCL 5 MG/5ML IJ SOLN
INTRAMUSCULAR | Status: DC | PRN
  Administered 2011-04-22 (×2): 1 mg via INTRAVENOUS

## 2011-04-22 MED ORDER — PROPOFOL 10 MG/ML IV EMUL
INTRAVENOUS | Status: DC | PRN
  Administered 2011-04-22: 140 mg via INTRAVENOUS

## 2011-04-22 MED ORDER — PAROXETINE HCL 20 MG PO TABS
40.0000 mg | ORAL_TABLET | Freq: Every day | ORAL | Status: DC
  Filled 2011-04-22 (×2): qty 2

## 2011-04-22 SURGICAL SUPPLY — 54 items
APL SKNCLS STERI-STRIP NONHPOA (GAUZE/BANDAGES/DRESSINGS) ×1
BENZOIN TINCTURE PRP APPL 2/3 (GAUZE/BANDAGES/DRESSINGS) ×2 IMPLANT
BLADE SURG ROTATE 9660 (MISCELLANEOUS) IMPLANT
BUR ACORN 6.0 (BURR) IMPLANT
BUR MATCHSTICK NEURO 3.0 LAGG (BURR) ×2 IMPLANT
CANISTER SUCTION 2500CC (MISCELLANEOUS) ×2 IMPLANT
CLOSURE STERI STRIP 1/2 X4 (GAUZE/BANDAGES/DRESSINGS) ×2 IMPLANT
CLOTH BEACON ORANGE TIMEOUT ST (SAFETY) ×2 IMPLANT
CONT SPEC 4OZ CLIKSEAL STRL BL (MISCELLANEOUS) ×2 IMPLANT
DRAPE LAPAROTOMY 100X72X124 (DRAPES) ×2 IMPLANT
DRAPE MICROSCOPE LEICA (MISCELLANEOUS) ×2 IMPLANT
DRAPE POUCH INSTRU U-SHP 10X18 (DRAPES) ×2 IMPLANT
DRSG PAD ABDOMINAL 8X10 ST (GAUZE/BANDAGES/DRESSINGS) IMPLANT
DURAPREP 26ML APPLICATOR (WOUND CARE) ×2 IMPLANT
ELECT REM PT RETURN 9FT ADLT (ELECTROSURGICAL) ×2
ELECTRODE REM PT RTRN 9FT ADLT (ELECTROSURGICAL) ×1 IMPLANT
GAUZE SPONGE 4X4 16PLY XRAY LF (GAUZE/BANDAGES/DRESSINGS) IMPLANT
GLOVE BIOGEL M 8.0 STRL (GLOVE) ×2 IMPLANT
GLOVE BIOGEL PI IND STRL 7.5 (GLOVE) ×2 IMPLANT
GLOVE BIOGEL PI INDICATOR 7.5 (GLOVE) ×2
GLOVE EXAM NITRILE LRG STRL (GLOVE) IMPLANT
GLOVE EXAM NITRILE MD LF STRL (GLOVE) IMPLANT
GLOVE EXAM NITRILE XL STR (GLOVE) IMPLANT
GLOVE EXAM NITRILE XS STR PU (GLOVE) IMPLANT
GLOVE INDICATOR 7.5 STRL GRN (GLOVE) ×2 IMPLANT
GOWN BRE IMP SLV AUR LG STRL (GOWN DISPOSABLE) ×2 IMPLANT
GOWN BRE IMP SLV AUR XL STRL (GOWN DISPOSABLE) IMPLANT
GOWN STRL REIN 2XL LVL4 (GOWN DISPOSABLE) IMPLANT
KIT BASIN OR (CUSTOM PROCEDURE TRAY) ×2 IMPLANT
KIT ROOM TURNOVER OR (KITS) ×2 IMPLANT
NEEDLE HYPO 18GX1.5 BLUNT FILL (NEEDLE) IMPLANT
NEEDLE HYPO 21X1.5 SAFETY (NEEDLE) ×2 IMPLANT
NEEDLE HYPO 25X1 1.5 SAFETY (NEEDLE) IMPLANT
NEEDLE SPNL 20GX3.5 QUINCKE YW (NEEDLE) IMPLANT
NS IRRIG 1000ML POUR BTL (IV SOLUTION) ×2 IMPLANT
PACK LAMINECTOMY NEURO (CUSTOM PROCEDURE TRAY) ×2 IMPLANT
PAD ARMBOARD 7.5X6 YLW CONV (MISCELLANEOUS) ×6 IMPLANT
PATTIES SURGICAL .5 X1 (DISPOSABLE) ×2 IMPLANT
RUBBERBAND STERILE (MISCELLANEOUS) ×4 IMPLANT
SPONGE GAUZE 4X4 12PLY (GAUZE/BANDAGES/DRESSINGS) ×2 IMPLANT
SPONGE LAP 4X18 X RAY DECT (DISPOSABLE) IMPLANT
SPONGE SURGIFOAM ABS GEL SZ50 (HEMOSTASIS) ×2 IMPLANT
STRIP CLOSURE SKIN 1/2X4 (GAUZE/BANDAGES/DRESSINGS) ×2 IMPLANT
SUT VIC AB 0 CT1 18XCR BRD8 (SUTURE) ×1 IMPLANT
SUT VIC AB 0 CT1 8-18 (SUTURE) ×2
SUT VIC AB 2-0 CP2 18 (SUTURE) ×2 IMPLANT
SUT VIC AB 3-0 SH 8-18 (SUTURE) ×2 IMPLANT
SYR 20CC LL (SYRINGE) ×2 IMPLANT
SYR 20ML ECCENTRIC (SYRINGE) ×2 IMPLANT
SYR 5ML LL (SYRINGE) IMPLANT
TAPE CLOTH SURG 4X10 WHT LF (GAUZE/BANDAGES/DRESSINGS) ×2 IMPLANT
TOWEL OR 17X24 6PK STRL BLUE (TOWEL DISPOSABLE) ×2 IMPLANT
TOWEL OR 17X26 10 PK STRL BLUE (TOWEL DISPOSABLE) ×2 IMPLANT
WATER STERILE IRR 1000ML POUR (IV SOLUTION) ×2 IMPLANT

## 2011-04-22 NOTE — Preoperative (Signed)
Beta Blockers   Reason not to administer Beta Blockers:Not Applicable 

## 2011-04-22 NOTE — Anesthesia Preprocedure Evaluation (Signed)
Anesthesia Evaluation  Patient identified by MRN, date of birth, ID band Patient awake    Reviewed: Allergy & Precautions, H&P , NPO status , Patient's Chart, lab work & pertinent test results  Airway Mallampati: II TM Distance: >3 FB Neck ROM: Full    Dental No notable dental hx. (+) Teeth Intact   Pulmonary shortness of breath, sleep apnea ,  clear to auscultation  Pulmonary exam normal       Cardiovascular hypertension, On Medications Regular Normal    Neuro/Psych  Headaches, PSYCHIATRIC DISORDERS    GI/Hepatic Neg liver ROS, GERD-  Medicated and Controlled,  Endo/Other  Negative Endocrine ROS  Renal/GU negative Renal ROS  Genitourinary negative   Musculoskeletal   Abdominal   Peds  Hematology negative hematology ROS (+)   Anesthesia Other Findings   Reproductive/Obstetrics negative OB ROS                           Anesthesia Physical Anesthesia Plan  ASA: III  Anesthesia Plan: General   Post-op Pain Management:    Induction: Intravenous  Airway Management Planned: Oral ETT  Additional Equipment:   Intra-op Plan:   Post-operative Plan: Extubation in OR  Informed Consent: I have reviewed the patients History and Physical, chart, labs and discussed the procedure including the risks, benefits and alternatives for the proposed anesthesia with the patient or authorized representative who has indicated his/her understanding and acceptance.     Plan Discussed with: CRNA  Anesthesia Plan Comments:         Anesthesia Quick Evaluation

## 2011-04-22 NOTE — H&P (Signed)
Gordon Roy is an 50 y.o. male.   Chief Complaint:left leg pain HPI: lower back pain with radiation to left foot which is getting worse and unable to sleep. Had a myelogram which is positive for a large left l5 s1 hnp Past Medical History  Diagnosis Date  . Shortness of breath   . Sleep apnea     sleep study- results pending   . GERD (gastroesophageal reflux disease)   . Headache   . Arthritis     lumbar stenosis, hnp  . Depression   . Anxiety     panic attack- last one several yrs. ago  . Hypertension     ER visit for chest pain in past but pt. remarks that he was told that it was reflux     Past Surgical History  Procedure Date  . Inguinal hernia repair     right side- 2003  . Back surgery     x3- lumbar  . Appendectomy   . Ankle arthroscopy     right- 2003  . Nasal sinus surgery     /w trauma to brain during surgery ( done in [place]) transferred to [facility] & had reconstructive surgery on sinuses & brain      Family History  Problem Relation Age of Onset  . Anesthesia problems Neg Hx   . Hypotension Neg Hx   . Malignant hyperthermia Neg Hx   . Pseudochol deficiency Neg Hx    Social History:  reports that he has been smoking Cigarettes.  He has a 30 pack-year smoking history. He does not have any smokeless tobacco history on file. He reports that he does not drink alcohol or use illicit drugs.  Allergies:  Allergies  Allergen Reactions  . Hydrocodone Other (See Comments)    Numbness in face  . Morphine And Related Itching    Medications Prior to Admission  Medication Dose Route Frequency Provider Last Rate Last Dose  . ceFAZolin (ANCEF) 2-3 GM-% IVPB SOLR           . ceFAZolin (ANCEF) IVPB 1 g/50 mL premix  1 g Intravenous 60 min Pre-Op Tanya Nones Penne Rosenstock      . iohexol (OMNIPAQUE) 180 MG/ML injection 15 mL  15 mL Intrathecal Once PRN Janeece Riggers Shogry   15 mL at 04/20/11 4098  . mupirocin ointment (BACTROBAN) 2 %           . oxyCODONE (Oxy IR/ROXICODONE)  immediate release tablet 15 mg  15 mg Oral Once Zenon Mayo, MD   15 mg at 04/22/11 1055  . DISCONTD: diazepam (VALIUM) tablet 10 mg  10 mg Oral Once [employer]      . DISCONTD: meperidine (DEMEROL) injection 50 mg  50 mg Intramuscular Once [employer]      . DISCONTD: ondansetron (ZOFRAN) injection 4 mg  4 mg Intramuscular Once Thomasenia Sales       No current outpatient prescriptions on file as of 04/22/2011.    Results for orders placed during the hospital encounter of 04/22/11 (from the past 48 hour(s))  CBC     Status: Abnormal   Collection Time   04/22/11 10:10 AM      Component Value Range Comment   WBC 12.8 (*) 4.0 - 10.5 (K/uL)    RBC 5.22  4.22 - 5.81 (MIL/uL)    Hemoglobin 16.9  13.0 - 17.0 (g/dL)    HCT 11.9  14.7 - 82.9 (%)    MCV 88.5  78.0 - 100.0 (fL)    MCH 32.4  26.0 - 34.0 (pg)    MCHC 36.6 (*) 30.0 - 36.0 (g/dL)    RDW 16.1  09.6 - 04.5 (%)    Platelets 277  150 - 400 (K/uL)    No results found.  Review of Systems  Constitutional: Negative.   HENT: Negative.   Eyes: Negative.   Respiratory: Negative.   Cardiovascular: Negative.   Gastrointestinal: Negative.   Musculoskeletal: Positive for back pain.  Skin: Negative.   Neurological: Positive for speech change and focal weakness.  Endo/Heme/Allergies: Negative.   Psychiatric/Behavioral: Negative.     Blood pressure 151/97, pulse 93, temperature 98.1 F (36.7 C), temperature source Oral, resp. rate 22, height 5\' 7"  (1.702 m), weight 97.523 kg (215 lb), SpO2 96.00%. Physical Exam limping from left leg .HENNTNL, NECK, NL. CV, NL. LUNGS, CLEAR.abdomen soft. extermities nl.NEURO WEAKNESS OF DF AND PF OF LEFT FOOT. SLR FEFT ,POSITIVE AT 10 DEGREES. NO ANKLE DTR.  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 ddd at l4 5 and l3 l4 on the right side with postop changes. He will be taken to surgery for a left l5 s1 discectomy and foraminotomy.  Gordon Roy  M 04/22/2011, 10:58 AM

## 2011-04-22 NOTE — Transfer of Care (Signed)
Immediate Anesthesia Transfer of Care Note  Patient: Gordon Roy  Procedure(s) Performed:  LUMBAR LAMINECTOMY/DECOMPRESSION MICRODISCECTOMY - Left Lumbar five - sacral one Diskectomy  Patient Location: PACU  Anesthesia Type: General  Level of Consciousness: awake and alert   Airway & Oxygen Therapy: Patient Spontanous Breathing and Patient connected to nasal cannula oxygen  Post-op Assessment: Report given to PACU RN and Post -op Vital signs reviewed and stable  Post vital signs: Reviewed and stable  Complications: No apparent anesthesia complications

## 2011-04-22 NOTE — Progress Notes (Signed)
Gave patient 3   5mg  oxycodone  As ordered by Dr. Sampson Goon.Marland KitchenMarland KitchenMarland Kitchen

## 2011-04-22 NOTE — Anesthesia Procedure Notes (Signed)
Procedure Name: Intubation Date/Time: 04/22/2011 1:21 PM Performed by: Caryn Bee Pre-anesthesia Checklist: Patient identified, Emergency Drugs available, Suction available, Patient being monitored and Timeout performed Patient Re-evaluated:Patient Re-evaluated prior to inductionOxygen Delivery Method: Circle System Utilized Preoxygenation: Pre-oxygenation with 100% oxygen Intubation Type: IV induction Ventilation: Mask ventilation without difficulty Laryngoscope Size: Mac and 3 Grade View: Grade II Tube type: Oral Number of attempts: 1 Airway Equipment and Method: stylet Placement Confirmation: ETT inserted through vocal cords under direct vision,  positive ETCO2 and breath sounds checked- equal and bilateral Secured at: 21 cm Tube secured with: Tape Dental Injury: Teeth and Oropharynx as per pre-operative assessment

## 2011-04-22 NOTE — Progress Notes (Signed)
Writer noted dressing to lower back fully saturated with serosanguinous drainage down to pad on bed. Dr. Wynetta Emery notified and ordered to change dressing as needed. Order carried out. Patient currently on abt tx.

## 2011-04-23 NOTE — Discharge Summary (Signed)
SL removed, catheter tip intact.  Pt given d/c instructions along with f/u apt to be made with Dr. Jeral Fruit.  Pt and wife verbalize d/c instructions along with back precautions.  Pt advised on proper body mechanics.  Pt d/c'd home via w/c accompanied by medical staff and wife.

## 2011-04-23 NOTE — Discharge Summary (Signed)
Physician Discharge Summary  Patient ID: Gordon Roy MRN: [id number] DOB/AGE: [birth year] 50 y.o.  Admit date: 04/22/2011 Discharge date: 04/23/2011  Admission Diagnoses:HNP lumbar  Discharge Diagnoses: sane Active Problems:  * No active hospital problems. *    Discharged Condition:  good  Hospital Course: Surgery one day..Home. The next. Did well  Consults: none  Significant Diagnostic Studies:none  Treatments:Lumbar microdisckectomy  Discharge Exam: Blood pressure 108/68, pulse 74, temperature 98 F (36.7 C), temperature source Oral, resp. rate 16, height 5\' 7"  (1.702 m), weight 97.523 kg (215 lb), SpO2 91.00%. no neuro issues  Disposition:   Discharge Orders    Future Orders Please Complete By Expires   Diet general      Discharge instructions      Comments:   Mostly bedrest. Get up 9 or 10 times each day and walk for 15-20 minutes each time. Very little sitting the first week. No riding in the car until your first post op appointment. If you had neck surgery...may shower from the chest down. If you had low back surgery....you may shower with a saran wrap covering over the incision. Take your pain medicine as needed...and other medicines that you are instructed to take. Call for an appointment...[phone].   Call MD for:  temperature >100.4      Call MD for:  persistant nausea and vomiting      Call MD for:  severe uncontrolled pain      Call MD for:  redness, tenderness, or signs of infection (pain, swelling, redness, odor or green/yellow discharge around incision site)      Call MD for:  difficulty breathing, headache or visual disturbances      Call MD for:  hives        Current Discharge Medication List    CONTINUE these medications which have NOT CHANGED   Details  amLODipine (NORVASC) 10 MG tablet Take 10 mg by mouth every morning.     diazepam (VALIUM) 5 MG tablet Take 5 mg by mouth every 6 (six) hours as needed. q 4-6 hrs. Prn for spasm       losartan-hydrochlorothiazide (HYZAAR) 100-25 MG per tablet Take 1 tablet by mouth every morning.     omeprazole (PRILOSEC) 40 MG capsule Take 40 mg by mouth daily at 2 PM daily at 2 PM.     oxyCODONE (ROXICODONE) 15 MG immediate release tablet Take 15 mg by mouth every 4 (four) hours as needed.      PARoxetine (PAXIL) 40 MG tablet Take 40 mg by mouth daily at 2 PM daily at 2 PM.     Pitavastatin Calcium (LIVALO) 4 MG TABS Take 1 tablet by mouth daily at 2 PM daily at 2 PM.     potassium chloride (K-DUR,KLOR-CON) 10 MEQ tablet Take 10 mEq by mouth daily.           At home rest most of the time. Get up 9 or 10 times each day and take a 15 or 20 minute walk. No riding in the car and to your first postoperative appointment. If you have neck surgery you may shower from the chest down starting on the third postoperative day. If you had back surgery he may start showering on the third postoperative day with saran wrap wrapped around your incisional area 3 times. After the shower remove the saran wrap. Take pain medicine as needed and other medications as instructed. Call my office for an appointment.  Signed: Reinaldo Meeker, MD 04/23/2011, 12:12 PM

## 2011-04-23 NOTE — Op Note (Signed)
NAMEZYMIER, RODGERS NO.:  [id number]  MEDICAL RECORD NO.:  [id number]  LOCATION:  3041                         FACILITY:  [facility]  PHYSICIAN:  Hilda Lias, M.D.   DATE OF BIRTH:  09/22/1960  DATE OF PROCEDURE:  04/22/2011 DATE OF DISCHARGE:                              OPERATIVE REPORT   POSTOPERATIVE DIAGNOSIS:  Left L5-S1 herniated disk with acute radiculopathy, status post right 4-5, 5-1 diskectomy.  POSTOPERATIVE DIAGNOSIS:  Left L5-S1 herniated disk with acute radiculopathy, status post right 4-5, 5-1 diskectomy.  PROCEDURES:  Left L5-S1 diskectomy with removal of large free fragment, decompression of the thecal sac as well as the S1 nerve root. Microscope.  SURGEON:  Hilda Lias, MD  ASSISTANT:  Stefani Dama, MD  CLINICAL HISTORY:  Mr. Stemmer came to my office 2 days ago complaining of back and left leg pain.  The patient had conservative treatment.  He called telling us that he was worse.  We did an MRI which shows degenerative changes in the right side at the level of L5-S1 which he has a large herniated disk with a fragment.  Surgery was advised.  The patient knew the risk of the surgery including possibility in the future he might require surgical fusion.  I talked to him and his wife in my office last night.  PROCEDURE:  The patient was taken to the OR and after intubation he was positioned in a prone manner.  The back was cleaned with DuraPrep.  A midline incision following the previous one was made through the skin and subcutaneous tissue straight to the lumbar area.  X-rays showed that we were indeed at L5-S1.  We brought the microscope into the area and with the drill we drilled the lower lamina of L5 and the upper of S1. The yellow ligament was also excised.  Immediately what we found that the patient had a large herniated disk going to the body of S1.  The incision was made and a large fragment was removed.  Then, there was  an opening of disk space.  We entered the disk space and total diskectomy was achieved.  At the end, we decompressed the thecal sac as well as the S1 nerve root.  The area was irrigated.  Fentanyl and Depo-Medrol were left in the epidural space.  The wound was closed with Vicryl and Steri- Strips.          ______________________________ Hilda Lias, M.D.    EB/MEDQ  D:  04/22/2011  T:  04/23/2011  Job:  [id number]

## 2011-04-23 NOTE — Plan of Care (Signed)
Problem: Consults Goal: Diagnosis - Spinal Surgery Outcome: Completed/Met Date Met:  04/23/11 Microdiscectomy

## 2011-04-25 MED FILL — Mupirocin Oint 2%: CUTANEOUS | Qty: 22 | Status: AC

## 2011-04-27 ENCOUNTER — Encounter (HOSPITAL_COMMUNITY): Payer: Self-pay | Admitting: Neurosurgery

## 2011-04-29 NOTE — Anesthesia Postprocedure Evaluation (Signed)
  Anesthesia Post-op Note  Patient: Gordon Roy  Procedure(s) Performed:  LUMBAR LAMINECTOMY/DECOMPRESSION MICRODISCECTOMY - Left Lumbar five - sacral one Diskectomy  Patient Location: PACU  Anesthesia Type: General  Level of Consciousness: awake  Airway and Oxygen Therapy: Patient Spontanous Breathing  Post-op Pain: mild  Post-op Assessment: Post-op Vital signs reviewed, Patient's Cardiovascular Status Stable and Respiratory Function Stable  Post-op Vital Signs: stable  Complications: No apparent anesthesia complications

## 2016-02-24 DIAGNOSIS — I493 Ventricular premature depolarization: Secondary | ICD-10-CM | POA: Insufficient documentation

## 2016-02-24 DIAGNOSIS — E876 Hypokalemia: Secondary | ICD-10-CM

## 2016-02-24 HISTORY — DX: Ventricular premature depolarization: I49.3

## 2016-02-24 HISTORY — DX: Hypokalemia: E87.6

## 2016-03-02 DIAGNOSIS — R079 Chest pain, unspecified: Secondary | ICD-10-CM

## 2016-03-02 HISTORY — DX: Chest pain, unspecified: R07.9

## 2016-03-23 DIAGNOSIS — I2510 Atherosclerotic heart disease of native coronary artery without angina pectoris: Secondary | ICD-10-CM | POA: Insufficient documentation

## 2016-03-23 HISTORY — DX: Atherosclerotic heart disease of native coronary artery without angina pectoris: I25.10

## 2019-08-20 ENCOUNTER — Encounter: Payer: Self-pay | Admitting: General Practice

## 2019-09-18 ENCOUNTER — Other Ambulatory Visit: Payer: Self-pay

## 2019-09-18 NOTE — Progress Notes (Signed)
Cardiology Office Note:    Date:  09/19/2019   ID:  Gordon Roy, DOB 12-16-1960, MRN [id number]  PCP:  Gordon Dandy, NP  Cardiologist:  Gordon More, MD   Referring MD: Gordon Dandy, NP  ASSESSMENT:    1. Ventricular premature depolarization   2. Mild CAD    PLAN:    In order of problems listed above:  1. With recent flareup PVCs we will recheck Roy 3-day ZIO monitor for PVC burden and complexity for now continue beta-blocker.  Also redo an ischemia evaluation with Roy Myoview.  I will see him back in the office in select 6 weeks to make Roy decision for requires further diagnostic testing or consideration of an antiarrhythmic drug like low-dose flecainide if his Myoview was normal.  Next appointment 6 weeks   Medication Adjustments/Labs and Tests Ordered: Current medicines are reviewed at length with the patient today.  Concerns regarding medicines are outlined above.  No orders of the defined types were placed in this encounter.  No orders of the defined types were placed in this encounter.    No chief complaint on file.   History of Present Illness:    Gordon Roy is Roy 59 y.o. male who is being seen today for the evaluation of PVCs at the request of [name], [name], NP. He was seen by me previously at [facility] cardiology 04/07/2016 with Roy history of chest pain minimal nonobstructive CAD and frequent PVCs.  Other medical problems including hypertension hyperlipidemia and hypothyroidism along with sleep apnea and GERD.  Left heart cath 03/12/2016: Diagnostic Procedure Summary Mild non-obstructive coronary artery disease. LMCA: 0%. LAD: Single stenosis. Lesion on Mid LAD: Proximal subsection.35% stenosis 8 mm length . Pre procedure TIMI III flow was noted. Good run off was present.Bifurcation lesion. LCx: 0%. RCA: 0%. Ramus: 0%. Normal LV function EF 60% normal function on left ventriculography  Recent labs from his primary care physician 09/18/2019 showed CMP  that was normal creatinine 1.02 potassium 4.4 normal liver function test CBC normal hemoglobin 13.7 lipid profile at target cholesterol 148 triglycerides 198 HDL 44 LDL 72  He is here because he recently had Roy flare of arrhythmia and made him feel very weak, fatigue lasted Roy few weeks and now is improved.  He is aware of extra beats but is usually not sustained and usually not symptomatic like this.  He has not had syncope chest pain or shortness of breath he takes no proarrhythmic drugs.  For further evaluation we will repeat Roy 3-day ZIO monitor to assess his PVC burden and repeat ischemia evaluation follow-up in the office in 6 weeks in the interim I have asked him to continue his beta-blocker Past Medical History:  Diagnosis Date  . Anxiety    panic attack- last one several yrs. ago  . Arthritis    lumbar stenosis, hnp  . BMI 36.0-36.9,adult   . Chest pain in adult 03/02/2016   Formatting of this note might be different from the original. Added automatically from request for surgery [id number]  . Depression   . Depression with anxiety   . DLE (discoid lupus erythematosus)   . Dyslipidemia   . Essential hypertension   . GERD (gastroesophageal reflux disease)   . Gout, arthropathy   . Headache(784.0)   . Hypercholesteremia   . Hyperglycemia   . Hypertension    ER visit for chest pain in past but pt. remarks that he was told that it was reflux   .  Hypokalemia 02/24/2016  . Hypothyroidism   . Insomnia, unspecified   . Intermittent palpitations   . Mild CAD 03/23/2016   Formatting of this note might be different from the original. 35% proximal LAD  . Multilevel degenerative disc disease   . Obesity   . Personal history of tobacco use, presenting hazards to health   . PVC (premature ventricular contraction)   . Shortness of breath   . Sleep apnea    sleep study- results pending   . Ventricular premature depolarization 02/24/2016    Past Surgical History:  Procedure Laterality Date  .  ANKLE ARTHROSCOPY  2003   right  . APPENDECTOMY    . BACK SURGERY  1996; 1998; 01/2001; 04/22/11   lumbar  . BRAIN SURGERY    . INGUINAL HERNIA REPAIR  2003   right side  . LUMBAR LAMINECTOMY/DECOMPRESSION MICRODISCECTOMY  04/22/2011   Procedure: LUMBAR LAMINECTOMY/DECOMPRESSION MICRODISCECTOMY;  Surgeon: Karn Cassis;  Location: [facility];  Service: Neurosurgery;  Laterality: Left;  Left Lumbar five - sacral one Diskectomy  . LUMBAR MICRODISCECTOMY  04/22/11   & foraminotomy; L5-S1; "my 4th back surgery"  . NASAL SINUS SURGERY  late 1990's   /w trauma to brain during surgery ( done in [place]) transferred to [facility] & had reconstructive surgery on sinuses & brain      Current Medications: Current Meds  Medication Sig  . acebutolol (SECTRAL) 400 MG capsule Take 400 mg by mouth 2 (two) times daily.  Marland Kitchen albuterol (VENTOLIN HFA) 108 (90 Base) MCG/ACT inhaler Inhale 2 puffs into the lungs every 6 (six) hours as needed.  . ALPRAZolam (XANAX) 0.5 MG tablet Take 0.5 mg by mouth every 6 (six) hours as needed.  Marland Kitchen amLODipine (NORVASC) 5 MG tablet Take 5 mg by mouth daily.  . celecoxib (CELEBREX) 200 MG capsule Take 200 mg by mouth daily.  . Glucosamine HCl 1000 MG TABS Take 1,000 mg by mouth 2 (two) times daily.  Marland Kitchen HEMP OIL-VANILLYL BUTYL ETHER EX Apply 5 drops topically 2 (two) times daily.  . hydrochlorothiazide (HYDRODIURIL) 25 MG tablet Take 25 mg by mouth daily.  Marland Kitchen levothyroxine (SYNTHROID) 125 MCG tablet Take 125 mcg by mouth daily.  Marland Kitchen losartan (COZAAR) 100 MG tablet Take 100 mg by mouth daily.  . melatonin 3 MG TABS tablet Take 3 mg by mouth at bedtime.  Marland Kitchen omeprazole (PRILOSEC) 40 MG capsule Take 40 mg by mouth daily at 2 PM daily at 2 PM.   . PARoxetine (PAXIL) 40 MG tablet Take 40 mg by mouth daily at 2 PM daily at 2 PM.   . Pitavastatin Calcium (LIVALO) 4 MG TABS Take 1 tablet by mouth daily at 2 PM daily at 2 PM.      Allergies:   Codeine, Hydrocodone, Niaspan [niacin],  Vytorin [ezetimibe-simvastatin], Morphine and related, and Other   Social History   Socioeconomic History  . Marital status: Married    Spouse name: Not on file  . Number of children: Not on file  . Years of education: Not on file  . Highest education level: Not on file  Occupational History  . Not on file  Tobacco Use  . Smoking status: Current Every Day Smoker    Packs/day: 1.00    Years: 30.00    Pack years: 30.00    Types: Cigarettes  . Smokeless tobacco: Current User    Types: Snuff  . Tobacco comment: consult entered  Substance and Sexual Activity  . Alcohol use: No  .  Drug use: No  . Sexual activity: Yes  Other Topics Concern  . Not on file  Social History Narrative  . Not on file   Social Determinants of Health   Financial Resource Strain:   . Difficulty of Paying Living Expenses:   Food Insecurity:   . Worried About Programme researcher, broadcasting/film/video in the Last Year:   . Barista in the Last Year:   Transportation Needs:   . Freight forwarder (Medical):   Marland Kitchen Lack of Transportation (Non-Medical):   Physical Activity:   . Days of Exercise per Week:   . Minutes of Exercise per Session:   Stress:   . Feeling of Stress :   Social Connections:   . Frequency of Communication with Friends and Family:   . Frequency of Social Gatherings with Friends and Family:   . Attends Religious Services:   . Active Member of Clubs or Organizations:   . Attends Banker Meetings:   Marland Kitchen Marital Status:      Family History: The patient's family history includes Arthritis in his mother; Cerebrovascular Accident in his father; Colon cancer in his sister; Diabetes in his mother; Heart disease in his mother; Hyperlipidemia in his mother; Hypertension in his mother. There is no history of Anesthesia problems, Hypotension, Malignant hyperthermia, or Pseudochol deficiency.  ROS:   ROS Please see the history of present illness.     All other systems reviewed and are  negative.  EKGs/Labs/Other Studies Reviewed:    The following studies were reviewed today:   EKG:  EKG is  ordered today.  The ekg ordered today is personally reviewed and demonstrates sinus rhythm frequent PVCs trigeminy   Physical Exam:    VS:  BP 128/84   Pulse 70   Ht 5\' 8"  (1.727 m)   Wt 223 lb 6.4 oz (101.3 kg)   SpO2 97%   BMI 33.97 kg/m     Wt Readings from Last 3 Encounters:  09/19/19 223 lb 6.4 oz (101.3 kg)  04/22/11 215 lb (97.5 kg)     GEN:  Well nourished, well developed in no acute distress HEENT: Normal NECK: No JVD; No carotid bruits LYMPHATICS: No lymphadenopathy CARDIAC: RRR, no murmurs, rubs, gallops RESPIRATORY:  Clear to auscultation without rales, wheezing or rhonchi  ABDOMEN: Soft, non-tender, non-distended MUSCULOSKELETAL:  No edema; No deformity  SKIN: Warm and dry NEUROLOGIC:  Alert and oriented x 3 PSYCHIATRIC:  Normal affect     Signed, 04/24/11, MD  09/19/2019 9:32 AM    [facility]

## 2019-09-19 ENCOUNTER — Other Ambulatory Visit: Payer: Self-pay

## 2019-09-19 ENCOUNTER — Encounter: Payer: Self-pay | Admitting: Cardiology

## 2019-09-19 ENCOUNTER — Ambulatory Visit (INDEPENDENT_AMBULATORY_CARE_PROVIDER_SITE_OTHER): Payer: 59

## 2019-09-19 ENCOUNTER — Ambulatory Visit (INDEPENDENT_AMBULATORY_CARE_PROVIDER_SITE_OTHER): Payer: Self-pay | Admitting: Cardiology

## 2019-09-19 VITALS — BP 128/84 | HR 70 | Ht 68.0 in | Wt 223.4 lb

## 2019-09-19 DIAGNOSIS — I493 Ventricular premature depolarization: Secondary | ICD-10-CM | POA: Diagnosis not present

## 2019-09-19 DIAGNOSIS — I2510 Atherosclerotic heart disease of native coronary artery without angina pectoris: Secondary | ICD-10-CM

## 2019-09-19 NOTE — Patient Instructions (Signed)
Medication Instructions:  Your physician recommends that you continue on your current medications as directed. Please refer to the Current Medication list given to you today.  *If you need a refill on your cardiac medications before your next appointment, please call your pharmacy*   Lab Work: None If you have labs (blood work) drawn today and your tests are completely normal, you will receive your results only by: Marland Kitchen MyChart Message (if you have MyChart) OR . A paper copy in the mail If you have any lab test that is abnormal or we need to change your treatment, we will call you to review the results.   Testing/Procedures: A zio monitor was ordered today. It will remain on for 3 days. You will then return monitor and event diary in provided box. It takes 1-2 weeks for report to be downloaded and returned to [place]. We will call you with the results. If monitor falls off or has orange flashing light, please call Zio for further instructions.     [facility] [address] [place], [place] [address] Phone:  [phone]    Please arrive 15 minutes prior to your appointment time for registration and insurance purposes.  The test will take approximately 3 to 4 hours to complete; you may bring reading material.  If someone comes with you to your appointment, they will need to remain in the main lobby due to limited space in the testing area. **If you are pregnant or breastfeeding, please notify the nuclear lab prior to your appointment**  How to prepare for your Myocardial Perfusion Test: . Do not eat or drink 3 hours prior to your test, except you may have water. . Do not consume products containing caffeine (regular or decaffeinated) 12 hours prior to your test. (ex: coffee, chocolate, sodas, tea). . Do bring a list of your current medications with you.  If not listed below, you may take your medications as normal. . Do wear comfortable clothes (no dresses or  overalls) and walking shoes, tennis shoes preferred (No heels or open toe shoes are allowed). . Do NOT wear cologne, perfume, aftershave, or lotions (deodorant is allowed). . If these instructions are not followed, your test will have to be rescheduled.  Please report to [address] for your test.  If you have questions or concerns about your appointment, you can call the [facility] Lab at [phone].  If you cannot keep your appointment, please provide 24 hours notification to the Nuclear Lab, to avoid a possible $50 charge to your account.      Follow-Up: At [facility], you and your health needs are our priority.  As part of our continuing mission to provide you with exceptional heart care, we have created designated Provider Care Teams.  These Care Teams include your primary Cardiologist (physician) and Advanced Practice Providers (APPs -  Physician Assistants and Nurse Practitioners) who all work together to provide you with the care you need, when you need it.  We recommend signing up for the patient portal called "MyChart".  Sign up information is provided on this After Visit Summary.  MyChart is used to connect with patients for Virtual Visits (Telemedicine).  Patients are able to view lab/test results, encounter notes, upcoming appointments, etc.  Non-urgent messages can be sent to your provider as well.   To learn more about what you can do with MyChart, go to [email or web address].    Your next appointment:   6  week(s)  The format for your next appointment:   In Person  Provider:   Shirlee More, MD   Other Instructions

## 2019-10-02 ENCOUNTER — Telehealth: Payer: Self-pay | Admitting: *Deleted

## 2019-10-02 ENCOUNTER — Encounter: Payer: Self-pay | Admitting: *Deleted

## 2019-10-02 NOTE — Telephone Encounter (Signed)
Patient called back regarding the voicemail left on his phone.  I read him all the instructions from the letter that was sent to be mailed today for his upcoming appointment on 10-09-19.

## 2019-10-02 NOTE — Telephone Encounter (Signed)
Left message on voicemail in reference to upcoming appointment scheduled for 10/09/2019. Phone number given for a call back so details instructions can be given. Satina Jerrell, Adelene Idler Mychart letter sent with instructions

## 2019-10-09 ENCOUNTER — Telehealth: Payer: Self-pay

## 2019-10-09 ENCOUNTER — Ambulatory Visit (INDEPENDENT_AMBULATORY_CARE_PROVIDER_SITE_OTHER): Payer: 59

## 2019-10-09 ENCOUNTER — Other Ambulatory Visit: Payer: Self-pay

## 2019-10-09 VITALS — Ht 68.0 in | Wt 223.0 lb

## 2019-10-09 DIAGNOSIS — I2510 Atherosclerotic heart disease of native coronary artery without angina pectoris: Secondary | ICD-10-CM | POA: Diagnosis not present

## 2019-10-09 DIAGNOSIS — R079 Chest pain, unspecified: Secondary | ICD-10-CM

## 2019-10-09 DIAGNOSIS — I493 Ventricular premature depolarization: Secondary | ICD-10-CM | POA: Diagnosis not present

## 2019-10-09 LAB — MYOCARDIAL PERFUSION IMAGING
LV dias vol: 90 mL (ref 62–150)
LV sys vol: 38 mL
Peak HR: 87 {beats}/min
Rest HR: 64 {beats}/min
SDS: 2
SRS: 0
SSS: 2
TID: 1

## 2019-10-09 MED ORDER — TECHNETIUM TC 99M TETROFOSMIN IV KIT
10.8000 | PACK | Freq: Once | INTRAVENOUS | Status: AC | PRN
  Administered 2019-10-09: 10.8 via INTRAVENOUS

## 2019-10-09 MED ORDER — TECHNETIUM TC 99M TETROFOSMIN IV KIT
29.6000 | PACK | Freq: Once | INTRAVENOUS | Status: AC | PRN
  Administered 2019-10-09: 29.6 via INTRAVENOUS

## 2019-10-09 MED ORDER — REGADENOSON 0.4 MG/5ML IV SOLN
0.4000 mg | Freq: Once | INTRAVENOUS | Status: AC
Start: 2019-10-09 — End: 2019-10-09
  Administered 2019-10-09: 0.4 mg via INTRAVENOUS

## 2019-10-09 NOTE — Telephone Encounter (Signed)
Left message on patients voicemail to please return our call.   

## 2019-10-09 NOTE — Telephone Encounter (Signed)
-----   Message from Baldo Daub, MD sent at 10/09/2019  4:00 PM EDT ----- Normal or stable result  Very good result normal no blockages

## 2019-10-10 NOTE — Telephone Encounter (Signed)
Spoke with patient regarding results and recommendation.  Patient verbalizes understanding and is agreeable to plan of care. Advised patient to call back with any issues or concerns.  

## 2019-10-21 ENCOUNTER — Telehealth: Payer: Self-pay

## 2019-10-21 NOTE — Telephone Encounter (Signed)
Spoke with patient regarding results and recommendation.  Patient verbalizes understanding and is agreeable to plan of care. Advised patient to call back with any issues or concerns.  

## 2019-10-21 NOTE — Telephone Encounter (Signed)
-----   Message from Baldo Daub, MD sent at 10/19/2019 12:42 PM EDT ----- Normal or stable result  He has ventricular premature contractions however the burden is less than 10% his myocardial perfusion study was normal and I think at this time I would continue his current beta-blocker and he does not need an additional antiarrhythmic drug.

## 2019-11-10 NOTE — Progress Notes (Signed)
Cardiology Office Note:    Date:  11/12/2019   ID:  Gordon Roy, DOB 06-16-1960, MRN [id number]  PCP:  Hurshel Party, NP  Cardiologist:  Norman Herrlich, MD    Referring MD: Hurshel Party, NP    ASSESSMENT:    1. Frequent PVCs   2. Mild CAD   3. Mixed hyperlipidemia   4. Essential hypertension    PLAN:    In order of problems listed above:  1. Stable asymptomatic he has no PVCs on his EKG and continue his beta-blocker 2. Stable having no angina, CAD continue medical therapy including his beta-blocker and lipid-lowering statin. 3. Ideal lipids continue statin 4. Be at target he will continue his antihypertensive agent including ARB beta-blocker stop thiazide diuretic   Next appointment: 1 year   Medication Adjustments/Labs and Tests Ordered: Current medicines are reviewed at length with the patient today.  Concerns regarding medicines are outlined above.  Orders Placed This Encounter  Procedures  . EKG 12-Lead   No orders of the defined types were placed in this encounter.   Chief Complaint  Patient presents with  . Follow-up  . Coronary Artery Disease    History of Present Illness:    Gordon Roy is a 59 y.o. male with a hx of chest pain and minimal nonobstructive CAD and frequent PVCs last seen Sep 19, 2019.  After reviewing his testing he was started on a beta-blocker.  Left heart catheterization March 12, 2016 showed 35% proximal LAD atherosclerotic plaque no other obstructive CAD normal left ventricular function. Myocardial perfusion October 14, 2019 showing ejection fraction and function normal 58% and no evidence of ischemia.  He was noted to have frequent PVCs during the test.  Study Highlights   Nuclear stress EF: 58%.  There was no ST segment deviation noted during stress.  The study is normal.  This is a low risk study.  The left ventricular ejection fraction is normal (55-65%).  Frequent PVCs and ventricular bigeminy was seen during the  testing  He had a 3-day ZIO monitor showing frequent PVCs rare couplets and triplets PVC burden 8.6%. Study Highlights  A ZIO monitor was performed for 3 days beginning 09/19/2019 to assess palpitation. The predominant rhythm was sinus with average minimum of maximum rates of 75, 57 and 101 bpm. There were no pauses of 3 seconds or greater and no episodes of second or third-degree AV node block or sinus node exit block. Supraventricular ectopy was rare.  There were no episodes of SVT atrial fibrillation or flutter. Ventricular ectopy was frequent PVCs and rare couplets and triplets.  There were no episodes of ventricular tachycardia.  PVC burden 8.6%.  Longest episode of bigeminy was 11 minutes and 54 seconds.  The PVC morphology was uniform. There was 1 triggered event associated with ventricular bigeminy  Conclusion, frequent ventricular ectopy with couplets triplets and 1 triggered event associated with bigeminy.  Compliance with diet, lifestyle and medications: Yes  He was seen at [facility] 10/20/2019 with trauma loss of consciousness.  An extensive evaluation showing no brain injury Gwyndolyn Kaufman work including a potassium of 3.6 borderline low creatinine normal 0.87 GFR greater than 90 cc 17.5.  Showed no evidence of thoracic aortic injury he had moderate aortic calcification atherosclerosis no pericardial effusion and no pulmonary injury.  Made a full and complete recovery from his closed head trauma.  He really feels well he has no palpitation exercise intolerance shortness of breath or chest pain.  His blood pressure is consistently less than 130/80 and with previous hypokalemia borderline low potassium and PVCs he will stop his hydrochlorothiazide if he is consistently greater than 140/85 he will resume.  His lipids are at target he has mild CAD I told him I feel comfortable seeing him in the office 1 year in the absence of cardiac symptoms.  No chest pain shortness of breath palpitation  or syncope Past Medical History:  Diagnosis Date  . Anxiety    panic attack- last one several yrs. ago  . Arthritis    lumbar stenosis, hnp  . BMI 36.0-36.9,adult   . Chest pain in adult 03/02/2016   Formatting of this note might be different from the original. Added automatically from request for surgery [id number]  . Depression   . Depression with anxiety   . DLE (discoid lupus erythematosus)   . Dyslipidemia   . Essential hypertension   . GERD (gastroesophageal reflux disease)   . Gout, arthropathy   . Headache(784.0)   . Hypercholesteremia   . Hyperglycemia   . Hypertension    ER visit for chest pain in past but pt. remarks that he was told that it was reflux   . Hypokalemia 02/24/2016  . Hypothyroidism   . Insomnia, unspecified   . Intermittent palpitations   . Mild CAD 03/23/2016   Formatting of this note might be different from the original. 35% proximal LAD  . Multilevel degenerative disc disease   . Obesity   . Personal history of tobacco use, presenting hazards to health   . PVC (premature ventricular contraction)   . Shortness of breath   . Sleep apnea    sleep study- results pending   . Ventricular premature depolarization 02/24/2016    Past Surgical History:  Procedure Laterality Date  . ANKLE ARTHROSCOPY  2003   right  . APPENDECTOMY    . BACK SURGERY  1996; 1998; 01/2001; 04/22/11   lumbar  . BRAIN SURGERY    . INGUINAL HERNIA REPAIR  2003   right side  . LUMBAR LAMINECTOMY/DECOMPRESSION MICRODISCECTOMY  04/22/2011   Procedure: LUMBAR LAMINECTOMY/DECOMPRESSION MICRODISCECTOMY;  Surgeon: Karn Cassis;  Location: [facility];  Service: Neurosurgery;  Laterality: Left;  Left Lumbar five - sacral one Diskectomy  . LUMBAR MICRODISCECTOMY  04/22/11   & foraminotomy; L5-S1; "my 4th back surgery"  . NASAL SINUS SURGERY  late 1990's   /w trauma to brain during surgery ( done in [place]) transferred to [facility] & had reconstructive surgery on sinuses & brain       Current Medications: Current Meds  Medication Sig  . acebutolol (SECTRAL) 400 MG capsule Take 400 mg by mouth 2 (two) times daily.  Marland Kitchen albuterol (VENTOLIN HFA) 108 (90 Base) MCG/ACT inhaler Inhale 2 puffs into the lungs every 6 (six) hours as needed.  . ALPRAZolam (XANAX) 0.5 MG tablet Take 0.5 mg by mouth every 6 (six) hours as needed.  Marland Kitchen amLODipine (NORVASC) 5 MG tablet Take 5 mg by mouth daily.  . celecoxib (CELEBREX) 200 MG capsule Take 200 mg by mouth 2 (two) times daily.   Marland Kitchen HEMP OIL-VANILLYL BUTYL ETHER EX Apply 5 drops topically 2 (two) times daily.  . hydrochlorothiazide (HYDRODIURIL) 25 MG tablet Take 25 mg by mouth daily.  Marland Kitchen levothyroxine (SYNTHROID) 125 MCG tablet Take 125 mcg by mouth daily.  Marland Kitchen losartan (COZAAR) 100 MG tablet Take 100 mg by mouth daily.  . melatonin 3 MG TABS tablet Take 3 mg by mouth at  bedtime.  Marland Kitchen. omeprazole (PRILOSEC) 40 MG capsule Take 40 mg by mouth daily at 2 PM daily at 2 PM.   . PARoxetine (PAXIL) 40 MG tablet Take 40 mg by mouth daily at 2 PM daily at 2 PM.   . Pitavastatin Calcium (LIVALO) 4 MG TABS Take 1 tablet by mouth daily at 2 PM daily at 2 PM.      Allergies:   Codeine, Hydrocodone, Niaspan [niacin], Vytorin [ezetimibe-simvastatin], Morphine and related, and Other   Social History   Socioeconomic History  . Marital status: Married    Spouse name: Not on file  . Number of children: Not on file  . Years of education: Not on file  . Highest education level: Not on file  Occupational History  . Not on file  Tobacco Use  . Smoking status: Current Every Day Smoker    Packs/day: 1.00    Years: 30.00    Pack years: 30.00    Types: Cigarettes  . Smokeless tobacco: Current User    Types: Snuff  . Tobacco comment: consult entered  Substance and Sexual Activity  . Alcohol use: No  . Drug use: No  . Sexual activity: Yes  Other Topics Concern  . Not on file  Social History Narrative  . Not on file   Social Determinants of Health    Financial Resource Strain:   . Difficulty of Paying Living Expenses:   Food Insecurity:   . Worried About Programme researcher, broadcasting/film/videounning Out of Food in the Last Year:   . Baristaan Out of Food in the Last Year:   Transportation Needs:   . Freight forwarderLack of Transportation (Medical):   Marland Kitchen. Lack of Transportation (Non-Medical):   Physical Activity:   . Days of Exercise per Week:   . Minutes of Exercise per Session:   Stress:   . Feeling of Stress :   Social Connections:   . Frequency of Communication with Friends and Family:   . Frequency of Social Gatherings with Friends and Family:   . Attends Religious Services:   . Active Member of Clubs or Organizations:   . Attends BankerClub or Organization Meetings:   Marland Kitchen. Marital Status:      Family History: The patient's family history includes Arthritis in his mother; Cerebrovascular Accident in his father; Colon cancer in his sister; Diabetes in his mother; Heart disease in his mother; Hyperlipidemia in his mother; Hypertension in his mother. There is no history of Anesthesia problems, Hypotension, Malignant hyperthermia, or Pseudochol deficiency. ROS:   Please see the history of present illness.    All other systems reviewed and are negative.  EKGs/Labs/Other Studies Reviewed:    The following studies were reviewed today:  EKG:  EKG ordered today and personally reviewed.  The ekg ordered today demonstrates sinus rhythm normal no PVCs  Recent Labs: 08/13/2019 cholesterol 148 HDL 44 LDL 72 triglycerides 190 A1c at target 6.0 creatinine normal 1.02 TSH normal 4.0  Physical Exam:    VS:  BP 124/74   Pulse 67   Ht 5\' 8"  (1.727 m)   Wt 229 lb (103.9 kg)   SpO2 96%   BMI 34.82 kg/m     Wt Readings from Last 3 Encounters:  11/12/19 229 lb (103.9 kg)  10/09/19 223 lb (101.2 kg)  09/19/19 223 lb 6.4 oz (101.3 kg)     GEN:  Well nourished, well developed in no acute distress HEENT: Normal NECK: No JVD; No carotid bruits LYMPHATICS: No lymphadenopathy CARDIAC: RRR, no  murmurs,  rubs, gallops RESPIRATORY:  Clear to auscultation without rales, wheezing or rhonchi  ABDOMEN: Soft, non-tender, non-distended MUSCULOSKELETAL:  No edema; No deformity  SKIN: Warm and dry NEUROLOGIC:  Alert and oriented x 3 PSYCHIATRIC:  Normal affect    Signed, Norman Herrlich, MD  11/12/2019 3:39 PM    [facility]

## 2019-11-12 ENCOUNTER — Encounter: Payer: Self-pay | Admitting: Cardiology

## 2019-11-12 ENCOUNTER — Other Ambulatory Visit: Payer: Self-pay

## 2019-11-12 ENCOUNTER — Ambulatory Visit (INDEPENDENT_AMBULATORY_CARE_PROVIDER_SITE_OTHER): Payer: 59 | Admitting: Cardiology

## 2019-11-12 VITALS — BP 124/74 | HR 67 | Ht 68.0 in | Wt 229.0 lb

## 2019-11-12 DIAGNOSIS — I10 Essential (primary) hypertension: Secondary | ICD-10-CM

## 2019-11-12 DIAGNOSIS — E782 Mixed hyperlipidemia: Secondary | ICD-10-CM | POA: Diagnosis not present

## 2019-11-12 DIAGNOSIS — I493 Ventricular premature depolarization: Secondary | ICD-10-CM

## 2019-11-12 DIAGNOSIS — I2510 Atherosclerotic heart disease of native coronary artery without angina pectoris: Secondary | ICD-10-CM | POA: Diagnosis not present

## 2019-11-12 NOTE — Patient Instructions (Signed)
Medication Instructions:  Your physician has recommended you make the following change in your medication: STOP: HYDROCHLORATHIAZIDE  If your BP is repeatedly above 140/85, please restart this medication  *If you need a refill on your cardiac medications before your next appointment, please call your pharmacy*   Lab Work: None If you have labs (blood work) drawn today and your tests are completely normal, you will receive your results only by: Marland Kitchen MyChart Message (if you have MyChart) OR . A paper copy in the mail If you have any lab test that is abnormal or we need to change your treatment, we will call you to review the results.   Testing/Procedures: None   Follow-Up: At [facility], you and your health needs are our priority.  As part of our continuing mission to provide you with exceptional heart care, we have created designated Provider Care Teams.  These Care Teams include your primary Cardiologist (physician) and Advanced Practice Providers (APPs -  Physician Assistants and Nurse Practitioners) who all work together to provide you with the care you need, when you need it.  We recommend signing up for the patient portal called "MyChart".  Sign up information is provided on this After Visit Summary.  MyChart is used to connect with patients for Virtual Visits (Telemedicine).  Patients are able to view lab/test results, encounter notes, upcoming appointments, etc.  Non-urgent messages can be sent to your provider as well.   To learn more about what you can do with MyChart, go to [email or web address].    Your next appointment:   1 year(s)  The format for your next appointment:   In Person  Provider:   Norman Herrlich, MD   Other Instructions

## 2020-11-13 DIAGNOSIS — M539 Dorsopathy, unspecified: Secondary | ICD-10-CM | POA: Insufficient documentation

## 2020-11-13 DIAGNOSIS — E7800 Pure hypercholesterolemia, unspecified: Secondary | ICD-10-CM | POA: Insufficient documentation

## 2020-11-13 DIAGNOSIS — Z6836 Body mass index (BMI) 36.0-36.9, adult: Secondary | ICD-10-CM | POA: Insufficient documentation

## 2020-11-13 DIAGNOSIS — R0602 Shortness of breath: Secondary | ICD-10-CM | POA: Insufficient documentation

## 2020-11-13 DIAGNOSIS — E039 Hypothyroidism, unspecified: Secondary | ICD-10-CM | POA: Insufficient documentation

## 2020-11-13 DIAGNOSIS — K219 Gastro-esophageal reflux disease without esophagitis: Secondary | ICD-10-CM | POA: Insufficient documentation

## 2020-11-13 DIAGNOSIS — Z87891 Personal history of nicotine dependence: Secondary | ICD-10-CM

## 2020-11-13 DIAGNOSIS — M1990 Unspecified osteoarthritis, unspecified site: Secondary | ICD-10-CM | POA: Insufficient documentation

## 2020-11-13 DIAGNOSIS — F419 Anxiety disorder, unspecified: Secondary | ICD-10-CM | POA: Insufficient documentation

## 2020-11-13 DIAGNOSIS — F32A Depression, unspecified: Secondary | ICD-10-CM | POA: Insufficient documentation

## 2020-11-13 DIAGNOSIS — R002 Palpitations: Secondary | ICD-10-CM | POA: Insufficient documentation

## 2020-11-13 DIAGNOSIS — M109 Gout, unspecified: Secondary | ICD-10-CM | POA: Insufficient documentation

## 2020-11-13 DIAGNOSIS — E669 Obesity, unspecified: Secondary | ICD-10-CM | POA: Insufficient documentation

## 2020-11-13 DIAGNOSIS — E785 Hyperlipidemia, unspecified: Secondary | ICD-10-CM | POA: Insufficient documentation

## 2020-11-13 DIAGNOSIS — R739 Hyperglycemia, unspecified: Secondary | ICD-10-CM | POA: Insufficient documentation

## 2020-11-13 DIAGNOSIS — G4730 Sleep apnea, unspecified: Secondary | ICD-10-CM | POA: Insufficient documentation

## 2020-11-13 DIAGNOSIS — L930 Discoid lupus erythematosus: Secondary | ICD-10-CM | POA: Insufficient documentation

## 2020-11-13 DIAGNOSIS — I10 Essential (primary) hypertension: Secondary | ICD-10-CM | POA: Insufficient documentation

## 2020-11-13 DIAGNOSIS — G4700 Insomnia, unspecified: Secondary | ICD-10-CM | POA: Insufficient documentation

## 2020-11-13 HISTORY — DX: Personal history of nicotine dependence: Z87.891

## 2020-11-27 ENCOUNTER — Ambulatory Visit: Payer: 59 | Admitting: Cardiology

## 2020-11-27 NOTE — Progress Notes (Deleted)
Cardiology Office Note:    Date:  11/27/2020   ID:  Gordon Roy, DOB 01-17-1961, MRN [id number]  PCP:  Hurshel Party, NP  Cardiologist:  Norman Herrlich, MD    Referring MD: Hurshel Party, NP    ASSESSMENT:    No diagnosis found. PLAN:    In order of problems listed above:  ***   Next appointment: ***   Medication Adjustments/Labs and Tests Ordered: Current medicines are reviewed at length with the patient today.  Concerns regarding medicines are outlined above.  No orders of the defined types were placed in this encounter.  No orders of the defined types were placed in this encounter.   No chief complaint on file.   History of Present Illness:    Gordon Roy is a 60 y.o. male with a hx of mild nonobstructive CAD on coronary angiography in 2017 frequent PVCs with a PVC burden of 8.6% hypertension and hyperlipidemia last seen 11/12/2019. Compliance with diet, lifestyle and medications: *** Past Medical History:  Diagnosis Date   Anxiety    panic attack- last one several yrs. ago   Arthritis    lumbar stenosis, hnp   BMI 36.0-36.9,adult    Chest pain in adult 03/02/2016   Formatting of this note might be different from the original. Added automatically from request for surgery [id number]   Depression    Depression with anxiety    DLE (discoid lupus erythematosus)    Dyslipidemia    Essential hypertension    GERD (gastroesophageal reflux disease)    Gout, arthropathy    Headache(784.0)    Hypercholesteremia    Hyperglycemia    Hypertension    ER visit for chest pain in past but pt. remarks that he was told that it was reflux    Hypokalemia 02/24/2016   Hypothyroidism    Insomnia, unspecified    Intermittent palpitations    Mild CAD 03/23/2016   Formatting of this note might be different from the original. 35% proximal LAD   Multilevel degenerative disc disease    Obesity    Personal history of tobacco use, presenting hazards to health    PVC (premature  ventricular contraction)    Shortness of breath    Sleep apnea    sleep study- results pending    Ventricular premature depolarization 02/24/2016    Past Surgical History:  Procedure Laterality Date   ANKLE ARTHROSCOPY  2003   right   APPENDECTOMY     BACK SURGERY  1996; 1998; 01/2001; 04/22/11   lumbar   BRAIN SURGERY     INGUINAL HERNIA REPAIR  2003   right side   LUMBAR LAMINECTOMY/DECOMPRESSION MICRODISCECTOMY  04/22/2011   Procedure: LUMBAR LAMINECTOMY/DECOMPRESSION MICRODISCECTOMY;  Surgeon: Karn Cassis;  Location: [facility];  Service: Neurosurgery;  Laterality: Left;  Left Lumbar five - sacral one Diskectomy   LUMBAR MICRODISCECTOMY  04/22/11   & foraminotomy; L5-S1; "my 4th back surgery"   NASAL SINUS SURGERY  late 1990's   /w trauma to brain during surgery ( done in [place]) transferred to [facility] & had reconstructive surgery on sinuses & brain      Current Medications: No outpatient medications have been marked as taking for the 11/27/20 encounter (Appointment) with Baldo Daub, MD.     Allergies:   Codeine, Hydrocodone, Niaspan [niacin], Vytorin [ezetimibe-simvastatin], Morphine and related, and Other   Social History   Socioeconomic History   Marital status: Married    Spouse name: Not on  file   Number of children: Not on file   Years of education: Not on file   Highest education level: Not on file  Occupational History   Not on file  Tobacco Use   Smoking status: Every Day    Packs/day: 1.00    Years: 30.00    Pack years: 30.00    Types: Cigarettes   Smokeless tobacco: Current    Types: Snuff   Tobacco comments:    consult entered  Substance and Sexual Activity   Alcohol use: No   Drug use: No   Sexual activity: Yes  Other Topics Concern   Not on file  Social History Narrative   Not on file   Social Determinants of Health   Financial Resource Strain: Not on file  Food Insecurity: Not on file  Transportation Needs: Not on file   Physical Activity: Not on file  Stress: Not on file  Social Connections: Not on file     Family History: The patient's ***family history includes Arthritis in his mother; Cerebrovascular Accident in his father; Colon cancer in his sister; Diabetes in his mother; Heart disease in his mother; Hyperlipidemia in his mother; Hypertension in his mother. There is no history of Anesthesia problems, Hypotension, Malignant hyperthermia, or Pseudochol deficiency. ROS:   Please see the history of present illness.    All other systems reviewed and are negative.  EKGs/Labs/Other Studies Reviewed:    The following studies were reviewed today:  EKG:  EKG ordered today and personally reviewed.  The ekg ordered today demonstrates ***  Recent Labs: No results found for requested labs within last 8760 hours.  Recent Lipid Panel No results found for: CHOL, TRIG, HDL, CHOLHDL, VLDL, LDLCALC, LDLDIRECT  Physical Exam:    VS:  There were no vitals taken for this visit.    Wt Readings from Last 3 Encounters:  11/12/19 229 lb (103.9 kg)  10/09/19 223 lb (101.2 kg)  09/19/19 223 lb 6.4 oz (101.3 kg)     GEN: *** Well nourished, well developed in no acute distress HEENT: Normal NECK: No JVD; No carotid bruits LYMPHATICS: No lymphadenopathy CARDIAC: ***RRR, no murmurs, rubs, gallops RESPIRATORY:  Clear to auscultation without rales, wheezing or rhonchi  ABDOMEN: Soft, non-tender, non-distended MUSCULOSKELETAL:  No edema; No deformity  SKIN: Warm and dry NEUROLOGIC:  Alert and oriented x 3 PSYCHIATRIC:  Normal affect    Signed, Norman Herrlich, MD  11/27/2020 7:37 AM    [facility]

## 2021-01-13 ENCOUNTER — Encounter: Payer: Self-pay | Admitting: Physician Assistant

## 2021-01-14 ENCOUNTER — Encounter: Payer: Self-pay | Admitting: Physician Assistant

## 2021-01-22 ENCOUNTER — Other Ambulatory Visit (INDEPENDENT_AMBULATORY_CARE_PROVIDER_SITE_OTHER): Payer: 59

## 2021-01-22 ENCOUNTER — Other Ambulatory Visit: Payer: Self-pay

## 2021-01-22 ENCOUNTER — Ambulatory Visit (INDEPENDENT_AMBULATORY_CARE_PROVIDER_SITE_OTHER): Payer: 59 | Admitting: Physician Assistant

## 2021-01-22 VITALS — BP 162/112 | HR 71 | Ht 68.0 in | Wt 224.0 lb

## 2021-01-22 DIAGNOSIS — S060X9D Concussion with loss of consciousness of unspecified duration, subsequent encounter: Secondary | ICD-10-CM

## 2021-01-22 DIAGNOSIS — R413 Other amnesia: Secondary | ICD-10-CM

## 2021-01-22 HISTORY — DX: Other amnesia: R41.3

## 2021-01-22 LAB — CBC
HCT: 43.2 % (ref 39.0–52.0)
Hemoglobin: 14.6 g/dL (ref 13.0–17.0)
MCHC: 33.9 g/dL (ref 30.0–36.0)
MCV: 92.5 fl (ref 78.0–100.0)
Platelets: 263 10*3/uL (ref 150.0–400.0)
RBC: 4.67 Mil/uL (ref 4.22–5.81)
RDW: 13.7 % (ref 11.5–15.5)
WBC: 7.7 10*3/uL (ref 4.0–10.5)

## 2021-01-22 LAB — TSH: TSH: 3.4 u[IU]/mL (ref 0.35–5.50)

## 2021-01-22 NOTE — Progress Notes (Addendum)
Assessment/Plan:   Gordon Roy is a 60 y.o. 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  The patient also is being seen for back pain, rule out suspicious radiculopathy versus neuropathy.MoCA today is 24/30 with deficiencies in attention, delayed recall 3/5, abstraction, normal orientation 6/6   Recommendations:   Memory Loss   MRI brain with/without contrast to assess for underlying structural abnormality and assess vascular load  Neurocognitive testing to further evaluate cognitive concerns and determine underlying cause of memory changes, including potential contribution from sleep, anxiety, or depression  Referral to neurosurgery EMG-NCS to rule out neuropathy versus radiculopathy Discussed safety both in and out of the home.  Discussed the importance of regular daily schedule with inclusion of crossword puzzles to maintain brain function.  Continue to monitor mood with PCP.  Stay active at least 30 minutes at least 3 times a week.  Naps should be scheduled and should be no longer than 60 minutes and should not occur after 2 PM.  Mediterranean diet is recommended  Folllow up once results above are available   Subjective:    The patient is seen in neurologic consultation at the request of [name], [name], NP for the evaluation of memory.  The patient is here alone.  He is 60 year old man who was in his usual state of health, until he sustained a closed head injury after a tree hit him in the head while at work.  He had loss of consciousness during that period of time.  Since then, he states that he loses train of  thought, forgets people's names.  He denies any story repetition, or asking the same questions.  He lives with his wife and daughter who noticed that he could not remember topics that were discussed the prior day.  His mood is "fine ".  He does have a history of depression, taking medications for it, but there have been no recent changes in them.  He denies any irritability.  He never sleeps well, but this has been worse since the injury.  At times, he stays "hours awake during the night ".  He does report vivid dreams, at times waking up crying, mad, laughing.  He denies any sleepwalking.  He denies any hallucinations or paranoia.  He denies leaving objects in unusual places.  He is independent of bathing and dressing.  He catches himself to take a daytime medication at night or vice versa, which frustrates him.  He denies taking the wrong medications.  He is to buy a pillbox today.  His wife always has done the finances.  His appetite is good, denies trouble swallowing.  He rarely cooks, denies leaving the stove or the faucet on.   He has some balance problems, he "staggers a little when he turns around ".  He states that "the legs are giving me the feet, I had an issue before, where he had burning and weakness, and my veins were studied, and did not show nothing wrong ".  He denies using a walker or a cane.  For the last 2 weeks, his back has been hurting, worse with working around the house.  When he is standing, they burning sensation and  numbness becomes worse.  There is tingling after 10 minutes of standing up.  He feels that his hips hurt when he walks.  He denies any further falls or head injuries since the last in June.  He drives without getting lost.  He denies any new headaches, double vision, dizziness or vertigo, or tremors.  He denies any urine incontinence or retention, or numbness in the pudendal area.  He denies constipation or diarrhea.  Denies anosmia.  He uses a CPAP at night, cannot sleep  without it.  He denies alcohol.  He smokes.  He is family history is remarkable for mother with dementia.   Allergies  Allergen Reactions   Codeine    Hydrocodone Other (See Comments)    Numbness in face Other reaction(s): Other (See Comments) Numbness in face   Niaspan [Niacin]    Vytorin [Ezetimibe-Simvastatin]    Morphine And Related Itching   Other Itching    Current Outpatient Medications  Medication Instructions   acebutolol (SECTRAL) 400 mg, Oral, 2 times daily   albuterol (VENTOLIN HFA) 108 (90 Base) MCG/ACT inhaler 2 puffs, Inhalation, Every 6 hours PRN   ALPRAZolam (XANAX) 0.5 mg, Oral, Every 6 hours PRN   amLODipine (NORVASC) 5 mg, Oral, Daily   celecoxib (CELEBREX) 200 mg, Oral, 2 times daily   HEMP OIL-VANILLYL BUTYL ETHER EX 5 drops, 2 times daily   hydrochlorothiazide (HYDRODIURIL) 25 mg, Oral, Daily   levothyroxine (SYNTHROID) 125 mcg, Oral, Daily   losartan (COZAAR) 100 mg, Oral, Daily   melatonin 3 mg, Oral, Daily at bedtime   omeprazole (PRILOSEC) 40 mg, Daily   PARoxetine (PAXIL) 40 mg, Daily   Pitavastatin Calcium 4 MG TABS 1 tablet, Daily     VITALS:   Vitals:   01/22/21 0812  BP: (!) 162/112  Pulse: 71  SpO2: 99%  Weight: 224 lb (101.6 kg)  Height: 5\' 8"  (1.727 m)   No flowsheet data found.  PHYSICAL EXAM   HEENT:  Normocephalic, atraumatic. The mucous membranes are moist. The superficial temporal arteries are without ropiness or tenderness. Cardiovascular: Regular rate and rhythm. Lungs: Clear to auscultation bilaterally. Neck: There are no carotid bruits noted bilaterally.  NEUROLOGICAL: Montreal Cognitive Assessment  01/22/2021  Visuospatial/ Executive (0/5) 4  Naming (0/3) 3  Attention: Read list of digits (0/2) 1  Attention: Read list of letters (0/1) 1  Attention: Serial 7 subtraction starting at 100 (0/3) 3  Language: Repeat phrase (0/2) 2  Language : Fluency (0/1) 0  Abstraction (0/2) 0  Delayed Recall (0/5) 3  Orientation  (0/6) 6  Total 23  Adjusted Score (based on education) 24   No flowsheet data found.  No flowsheet data found.   Orientation:  Alert and oriented to person, place and time. No aphasia or dysarthria. Fund of knowledge is appropriate. Recent memory impaired and remote memory intact.  Attention and concentration are reduced.  Able to name objects and repeat phrases. Delayed recall 3/5 Cranial nerves: There is good facial symmetry. Extraocular muscles are intact and visual fields are full to confrontational testing. Speech is fluent and clear. Soft palate rises symmetrically and there is no tongue deviation. Hearing is intact to conversational tone. Tone: Tone is good throughout. Sensation: Sensation is intact to light touch and pinprick throughout the right, on the left, his sensation is decreased.  He has chronic decrease in sensation in both pretibial areas.. Vibration is intact at the right big toe, slightly less on the left.There is no extinction with  double simultaneous stimulation. Coordination: The patient has no difficulty with RAM's or FNF bilaterally. Normal finger to nose  Motor: Strength is 5/5 in the bilateral upper.  In the lower extremities, strength is /5. There is no pronator drift. There are no fasciculations noted.  Left toe strength 1/5 DTR's: Deep tendon reflexes are 2/4 at the left bilateral biceps, triceps, brachioradialis, patella and achilles.  On the right, reflexes are 1/4.  Plantar responses are downgoing bilaterally. Gait and Station: The patient is able to ambulate without difficulty.The patient is able to heel toe walk without any difficulty.The patient is able to ambulate in a tandem fashion. The patient is able to stand in the Romberg position.     Thank you for allowing [place] the opportunity to participate in the care of this nice patient. Please do not hesitate to contact us for any questions or concerns.   Total time spent on today's visit was  60 minutes, including  both face-to-face time and nonface-to-face time.  Time included that spent on review of records (prior notes available to me/labs/imaging if pertinent), discussing treatment and goals, answering patient's questions and coordinating care.  Cc:  Hurshel Party, NP  Marlowe Kays 01/22/2021 8:49 AM                                                                                                                                                                                                                                                                                                                                                                                                                                                                                                                                                                                                                                     .

## 2021-01-22 NOTE — Patient Instructions (Addendum)
It was a pleasure to see you today at our office.   Recommendations:  Neurocognitive evaluation at our office MRI of the brain, the radiology office will call you to arrange you appointment Check labs today Follow up after neurocognitive testing EMG/NCS Referral to Neurosurgery  Your provider has requested that you have labwork completed today. The lab is located on the Second floor at [address], within the [facility] office. When you get off the elevator, turn right and go in the [facility] [address]; the first brown door on the left.  Tell the ladies behind the desk that you are there for lab work. If you are not called within 15 minutes please check with the front desk.   Once you complete your labs you are free to go. You will receive a call or message via MyChart with your lab results.     RECOMMENDATIONS FOR ALL PATIENTS WITH MEMORY PROBLEMS: 1. Continue to exercise (Recommend 30 minutes of walking everyday, or 3 hours every week) 2. Increase social interactions - continue going to [place] and enjoy social gatherings with friends and family 3. Eat healthy, avoid fried foods and eat more fruits and vegetables 4. Maintain adequate blood pressure, blood sugar, and blood cholesterol level. Reducing the risk of stroke and cardiovascular disease also helps promoting better memory. 5. Avoid stressful situations. Live a simple life and avoid aggravations. Organize your time and prepare for the next day in anticipation. 6. Sleep well, avoid any interruptions of sleep and avoid any distractions in the bedroom that may interfere with adequate sleep quality 7. Avoid sugar, avoid sweets as there is a strong link between excessive sugar intake, diabetes, and cognitive impairment We discussed the Mediterranean diet, which has been shown to help patients reduce the risk of progressive memory disorders and reduces cardiovascular risk. This includes eating fish, eat fruits and green  leafy vegetables, nuts like almonds and hazelnuts, walnuts, and also use olive oil. Avoid fast foods and fried foods as much as possible. Avoid sweets and sugar as sugar use has been linked to worsening of memory function.  There is always a concern of gradual progression of memory problems. If this is the case, then we may need to adjust level of care according to patient needs. Support, both to the patient and caregiver, should then be put into place.      You have been referred for a neuropsychological evaluation (i.e., evaluation of memory and thinking abilities). Please bring someone with you to this appointment if possible, as it is helpful for the doctor to hear from both you and another adult who knows you well. Please bring eyeglasses and hearing aids if you wear them.    The evaluation will take approximately 3 hours and has two parts:   The first part is a clinical interview with the neuropsychologist (Dr. Milbert Coulter or Dr. Roseanne Reno). During the interview, the neuropsychologist will speak with you and the individual you brought to the appointment.    The second part of the evaluation is testing with the doctor's technician Annabelle Harman or Selena Batten). During the testing, the technician will ask you to remember different types of material, solve problems, and answer some questionnaires. Your family member will not be present for this portion of the evaluation.   Please note: We must reserve several hours of the neuropsychologist's time and the psychometrician's time for your evaluation appointment. As such, there is a No-Show fee of $100. If you are unable to attend any of  your appointments, please contact our office as soon as possible to reschedule.    FALL PRECAUTIONS: Be cautious when walking. Scan the area for obstacles that may increase the risk of trips and falls. When getting up in the mornings, sit up at the edge of the bed for a few minutes before getting out of bed. Consider elevating the bed at  the head end to avoid drop of blood pressure when getting up. Walk always in a well-lit room (use night lights in the walls). Avoid area rugs or power cords from appliances in the middle of the walkways. Use a walker or a cane if necessary and consider physical therapy for balance exercise. Get your eyesight checked regularly.  FINANCIAL OVERSIGHT: Supervision, especially oversight when making financial decisions or transactions is also recommended.  HOME SAFETY: Consider the safety of the kitchen when operating appliances like stoves, microwave oven, and blender. Consider having supervision and share cooking responsibilities until no longer able to participate in those. Accidents with firearms and other hazards in the house should be identified and addressed as well.   ABILITY TO BE LEFT ALONE: If patient is unable to contact 911 operator, consider using LifeLine, or when the need is there, arrange for someone to stay with patients. Smoking is a fire hazard, consider supervision or cessation. Risk of wandering should be assessed by caregiver and if detected at any point, supervision and safe proof recommendations should be instituted.  MEDICATION SUPERVISION: Inability to self-administer medication needs to be constantly addressed. Implement a mechanism to ensure safe administration of the medications.   DRIVING: Regarding driving, in patients with progressive memory problems, driving will be impaired. We advise to have someone else do the driving if trouble finding directions or if minor accidents are reported. Independent driving assessment is available to determine safety of driving.   If you are interested in the driving assessment, you can contact the following:  [employer] in [place] [phone]  [facility] [phone]  [facility] [phone] or [phone]    Mediterranean Diet A Mediterranean diet  refers to food and lifestyle choices that are based on the traditions of countries located on the [employer]. This way of eating has been shown to help prevent certain conditions and improve outcomes for people who have chronic diseases, like kidney disease and heart disease. What are tips for following this plan? Lifestyle  Cook and eat meals together with your family, when possible. Drink enough fluid to keep your urine clear or pale yellow. Be physically active every day. This includes: Aerobic exercise like running or swimming. Leisure activities like gardening, walking, or housework. Get 7-8 hours of sleep each night. If recommended by your health care provider, drink red wine in moderation. This means 1 glass a day for nonpregnant women and 2 glasses a day for men. A glass of wine equals 5 oz (150 mL). Reading food labels  Check the serving size of packaged foods. For foods such as rice and pasta, the serving size refers to the amount of cooked product, not dry. Check the total fat in packaged foods. Avoid foods that have saturated fat or trans fats. Check the ingredients list for added sugars, such as corn syrup. Shopping  At the grocery store, buy most of your food from the areas near the walls of the store. This includes: Fresh fruits and vegetables (produce). Grains, beans, nuts, and seeds. Some of these may be available in unpackaged forms  or large amounts (in bulk). Fresh seafood. Poultry and eggs. Low-fat dairy products. Buy whole ingredients instead of prepackaged foods. Buy fresh fruits and vegetables in-season from local farmers markets. Buy frozen fruits and vegetables in resealable bags. If you do not have access to quality fresh seafood, buy precooked frozen shrimp or canned fish, such as tuna, salmon, or sardines. Buy small amounts of raw or cooked vegetables, salads, or olives from the deli or salad bar at your store. Stock your pantry so you always have certain  foods on hand, such as olive oil, canned tuna, canned tomatoes, rice, pasta, and beans. Cooking  Cook foods with extra-virgin olive oil instead of using butter or other vegetable oils. Have meat as a side dish, and have vegetables or grains as your main dish. This means having meat in small portions or adding small amounts of meat to foods like pasta or stew. Use beans or vegetables instead of meat in common dishes like chili or lasagna. Experiment with different cooking methods. Try roasting or broiling vegetables instead of steaming or sauteing them. Add frozen vegetables to soups, stews, pasta, or rice. Add nuts or seeds for added healthy fat at each meal. You can add these to yogurt, salads, or vegetable dishes. Marinate fish or vegetables using olive oil, lemon juice, garlic, and fresh herbs. Meal planning  Plan to eat 1 vegetarian meal one day each week. Try to work up to 2 vegetarian meals, if possible. Eat seafood 2 or more times a week. Have healthy snacks readily available, such as: Vegetable sticks with hummus. Greek yogurt. Fruit and nut trail mix. Eat balanced meals throughout the week. This includes: Fruit: 2-3 servings a day Vegetables: 4-5 servings a day Low-fat dairy: 2 servings a day Fish, poultry, or lean meat: 1 serving a day Beans and legumes: 2 or more servings a week Nuts and seeds: 1-2 servings a day Whole grains: 6-8 servings a day Extra-virgin olive oil: 3-4 servings a day Limit red meat and sweets to only a few servings a month What are my food choices? Mediterranean diet Recommended Grains: Whole-grain pasta. Brown rice. Bulgar wheat. Polenta. Couscous. Whole-wheat bread. Orpah Cobb. Vegetables: Artichokes. Beets. Broccoli. Cabbage. Carrots. Eggplant. Green beans. Chard. Kale. Spinach. Onions. Leeks. Peas. Squash. Tomatoes. Peppers. Radishes. Fruits: Apples. Apricots. Avocado. Berries. Bananas. Cherries. Dates. Figs. Grapes. Lemons. Melon. Oranges.  Peaches. Plums. Pomegranate. Meats and other protein foods: Beans. Almonds. Sunflower seeds. Pine nuts. Peanuts. Cod. Salmon. Scallops. Shrimp. Tuna. Tilapia. Clams. Oysters. Eggs. Dairy: Low-fat milk. Cheese. Greek yogurt. Beverages: Water. Red wine. Herbal tea. Fats and oils: Extra virgin olive oil. Avocado oil. Grape seed oil. Sweets and desserts: [place] yogurt with honey. Baked apples. Poached pears. Trail mix. Seasoning and other foods: Basil. Cilantro. Coriander. Cumin. Mint. Parsley. Sage. Rosemary. Tarragon. Garlic. Oregano. Thyme. Pepper. Balsalmic vinegar. Tahini. Hummus. Tomato sauce. Olives. Mushrooms. Limit these Grains: Prepackaged pasta or rice dishes. Prepackaged cereal with added sugar. Vegetables: Deep fried potatoes (french fries). Fruits: Fruit canned in syrup. Meats and other protein foods: Beef. Pork. Lamb. Poultry with skin. Hot dogs. Tomasa Blase. Dairy: Ice cream. Sour cream. Whole milk. Beverages: Juice. Sugar-sweetened soft drinks. Beer. Liquor and spirits. Fats and oils: Butter. Canola oil. Vegetable oil. Beef fat (tallow). Lard. Sweets and desserts: Cookies. Cakes. Pies. Candy. Seasoning and other foods: Mayonnaise. Premade sauces and marinades. The items listed may not be a complete list. Talk with your dietitian about what dietary choices are right for you. Summary The Mediterranean diet includes both  food and lifestyle choices. Eat a variety of fresh fruits and vegetables, beans, nuts, seeds, and whole grains. Limit the amount of red meat and sweets that you eat. Talk with your health care provider about whether it is safe for you to drink red wine in moderation. This means 1 glass a day for nonpregnant women and 2 glasses a day for men. A glass of wine equals 5 oz (150 mL). This information is not intended to replace advice given to you by your health care provider. Make sure you discuss any questions you have with your health care provider. Document Released:  12/03/2015 Document Revised: 01/05/2016 Document Reviewed: 12/03/2015 Elsevier Interactive Patient Education  2017 [employer].

## 2021-01-25 ENCOUNTER — Telehealth: Payer: Self-pay | Admitting: Physician Assistant

## 2021-01-25 NOTE — Telephone Encounter (Signed)
Spoke to patient and delivered lab results let patient know his MRI and referral to [facility] has been sent in

## 2021-01-25 NOTE — Progress Notes (Signed)
Voicemail full.

## 2021-01-25 NOTE — Telephone Encounter (Signed)
Patient called in stating he missed a call from [place] this morning. He thinks it might be about some results

## 2021-01-27 ENCOUNTER — Telehealth: Payer: Self-pay

## 2021-01-27 NOTE — Telephone Encounter (Signed)
-----   Message from Van Clines, MD sent at 01/27/2021 10:10 AM EDT ----- They are correct, it is not appropriate because that is not the reason for referral, it was for back pain, pls clarify for them the reason is for back pain and he has seen Dr. Jeral Fruit in the past for same reason. Thanks    ----- Message ----- From: Wende Mott, LPN Sent: 84/08/3644  10:05 AM EDT To: Van Clines, MD  Sorry, no its Neurosurgery that says referral is not appropriate for memory loss and conscussion. ----- Message ----- From: Van Clines, MD Sent: 01/27/2021   9:56 AM EDT To: Wende Mott, LPN  Is it the patient who does not think referral is necessary? He is a patient of Dr. Jeral Fruit, she is asking for him to see Dr. Jeral Fruit again for his back pain.    ----- Message ----- From: Wende Mott, LPN Sent: 80/06/2120   4:13 PM EDT To: Van Clines, MD  Can you look at [facility] notes for last visit, she referred patient to [place] [facility] and they don't think the referral is necessary there, please advise on Wednesday back to me, thanks

## 2021-01-27 NOTE — Telephone Encounter (Signed)
I left message with [facility] to call back to change referral for back pain.[phone], Rene Kocher. Awaiting for call back.

## 2021-02-10 NOTE — Progress Notes (Signed)
Cardiology Office Note:    Date:  02/11/2021   ID:  Gordon Roy, DOB 14-Nov-1960, MRN [id number]  PCP:  Hurshel Party, NP  Cardiologist:  Norman Herrlich, MD    Referring MD: Hurshel Party, NP    ASSESSMENT:    1. Frequent PVCs   2. Mild CAD   3. Mixed hyperlipidemia   4. Essential hypertension    PLAN:    In order of problems listed above:  He continues to do well since taking Sectral he has had no symptomatic arrhythmia at this time I do not think he needs an antiarrhythmic drug or EP evaluation.  I will ask him to avoid over-the-counter proarrhythmic medications Stable CAD having no anginal discomfort continue his current medical therapy including his beta-blocker and lipid-lowering with statin Continue statin lipids are at target Continue current antihypertensives hydrochlorothiazide beta-blocker   Next appointment: 1 year   Medication Adjustments/Labs and Tests Ordered: Current medicines are reviewed at length with the patient today.  Concerns regarding medicines are outlined above.  No orders of the defined types were placed in this encounter.  No orders of the defined types were placed in this encounter.  Follow-up for symptomatic PVCs.CC     History of Present Illness:    Gordon Roy is a 60 y.o. male with a hx of minimal obstructive CAD and frequent PVCs last seen 11/11/2020.  At that time he is markedly improved and was not having PVCs on his EKG or palpitation beta-blocker and had stable CAD.  Compliance with diet, lifestyle and medications: Yes  Continues to do well having symptomatic PVCs and abnormal CT generally office today he tolerates his beta-blocker he is not having chest pain edema shortness of breath but he has trouble with his legs fatiguing and tells me that secondary to disc disease and multiple back surgeries.  I was going to have him take his slacks and shoes off to examine his pulses and he told me he had noninvasive testing that was  normal  Left heart catheterization March 12, 2016 showed 35% proximal LAD atherosclerotic plaque no other obstructive CAD normal left ventricular function. Myocardial perfusion October 14, 2019 showing ejection fraction and function normal 58% and no evidence of ischemia.  He was noted to have frequent PVCs during the test.  03/07/2016 17:59   Angiographic findings   Cardiac Arteries and Lesion Findings  LMCA: 0%.  LAD: Single stenosis.    Lesion on Mid LAD: Proximal subsection.35% stenosis 8 mm length . Pre    procedure TIMI III flow was noted. Good run off was present.Bifurcation    lesion.  LCx: 0%.  RCA: 0%.  Ramus: 0%.   Study Highlights   Nuclear stress EF: 58%. There was no ST segment deviation noted during stress. The study is normal. This is a low risk study. The left ventricular ejection fraction is normal (55-65%). Frequent PVCs and ventricular bigeminy was seen during the testing   He had a 3-day ZIO monitor showing frequent PVCs rare couplets and triplets PVC burden 8.6%. Study Highlights   A ZIO monitor was performed for 3 days beginning 09/19/2019 to assess palpitation. The predominant rhythm was sinus with average minimum of maximum rates of 75, 57 and 101 bpm. There were no pauses of 3 seconds or greater and no episodes of second or third-degree AV node block or sinus node exit block. Supraventricular ectopy was rare.  There were no episodes of SVT atrial fibrillation or flutter. Ventricular ectopy  was frequent PVCs and rare couplets and triplets.  There were no episodes of ventricular tachycardia.  PVC burden 8.6%.  Longest episode of bigeminy was 11 minutes and 54 seconds.  The PVC morphology was uniform. There was 1 triggered event associated with ventricular bigeminy   Conclusion, frequent ventricular ectopy with couplets triplets and 1 triggered event associated with bigeminy.   Past Medical History:  Diagnosis Date   Anxiety    panic attack- last one  several yrs. ago   Arthritis    lumbar stenosis, hnp   BMI 36.0-36.9,adult    Chest pain in adult 03/02/2016   Formatting of this note might be different from the original. Added automatically from request for surgery [id number]   Depression    Depression with anxiety    DLE (discoid lupus erythematosus)    Dyslipidemia    Essential hypertension    GERD (gastroesophageal reflux disease)    Gout, arthropathy    Headache(784.0)    Hypercholesteremia    Hyperglycemia    Hypertension    ER visit for chest pain in past but pt. remarks that he was told that it was reflux    Hypokalemia 02/24/2016   Hypothyroidism    Insomnia, unspecified    Intermittent palpitations    Mild CAD 03/23/2016   Formatting of this note might be different from the original. 35% proximal LAD   Multilevel degenerative disc disease    Obesity    Personal history of tobacco use, presenting hazards to health    PVC (premature ventricular contraction)    Shortness of breath    Sleep apnea    sleep study- results pending    Ventricular premature depolarization 02/24/2016    Past Surgical History:  Procedure Laterality Date   ANKLE ARTHROSCOPY  2003   right   APPENDECTOMY     BACK SURGERY  1996; 1998; 01/2001; 04/22/11   lumbar   BRAIN SURGERY     INGUINAL HERNIA REPAIR  2003   right side   LUMBAR LAMINECTOMY/DECOMPRESSION MICRODISCECTOMY  04/22/2011   Procedure: LUMBAR LAMINECTOMY/DECOMPRESSION MICRODISCECTOMY;  Surgeon: Karn Cassis;  Location: [facility];  Service: Neurosurgery;  Laterality: Left;  Left Lumbar five - sacral one Diskectomy   LUMBAR MICRODISCECTOMY  04/22/11   & foraminotomy; L5-S1; "my 4th back surgery"   NASAL SINUS SURGERY  late 1990's   /w trauma to brain during surgery ( done in [place]) transferred to [facility] & had reconstructive surgery on sinuses & brain      Current Medications: Current Meds  Medication Sig   acebutolol (SECTRAL) 400 MG capsule Take 400 mg by mouth 2  (two) times daily.   albuterol (VENTOLIN HFA) 108 (90 Base) MCG/ACT inhaler Inhale 2 puffs into the lungs every 6 (six) hours as needed for wheezing or shortness of breath.   ALPRAZolam (XANAX) 0.5 MG tablet Take 0.5 mg by mouth every 6 (six) hours as needed for anxiety.   amLODipine (NORVASC) 5 MG tablet Take 5 mg by mouth daily.   celecoxib (CELEBREX) 200 MG capsule Take 200 mg by mouth 2 (two) times daily.    HEMP OIL-VANILLYL BUTYL ETHER EX Apply 5 drops topically 2 (two) times daily.   hydrochlorothiazide (HYDRODIURIL) 25 MG tablet Take 25 mg by mouth daily.   levothyroxine (SYNTHROID) 125 MCG tablet Take 125 mcg by mouth daily.   losartan (COZAAR) 100 MG tablet Take 100 mg by mouth daily.   melatonin 3 MG TABS tablet Take 3 mg by mouth  at bedtime.   omeprazole (PRILOSEC) 40 MG capsule Take 40 mg by mouth daily at 2 PM daily at 2 PM.    PARoxetine (PAXIL) 40 MG tablet Take 40 mg by mouth daily at 2 PM daily at 2 PM.    Pitavastatin Calcium 4 MG TABS Take 1 tablet by mouth daily at 2 PM daily at 2 PM.      Allergies:   Codeine, Hydrocodone, Niaspan [niacin], Vytorin [ezetimibe-simvastatin], Morphine and related, and Other   Social History   Socioeconomic History   Marital status: Married    Spouse name: Not on file   Number of children: Not on file   Years of education: Not on file   Highest education level: Not on file  Occupational History   Not on file  Tobacco Use   Smoking status: Every Day    Packs/day: 1.00    Years: 30.00    Pack years: 30.00    Types: Cigarettes   Smokeless tobacco: Current    Types: Snuff   Tobacco comments:    consult entered  Substance and Sexual Activity   Alcohol use: No   Drug use: No   Sexual activity: Yes  Other Topics Concern   Not on file  Social History Narrative   Not on file   Social Determinants of Health   Financial Resource Strain: Not on file  Food Insecurity: Not on file  Transportation Needs: Not on file  Physical  Activity: Not on file  Stress: Not on file  Social Connections: Not on file     Family History: The patient's family history includes Arthritis in his mother; Cerebrovascular Accident in his father; Colon cancer in his sister; Diabetes in his mother; Heart disease in his mother; Hyperlipidemia in his mother; Hypertension in his mother. There is no history of Anesthesia problems, Hypotension, Malignant hyperthermia, or Pseudochol deficiency. ROS:   Please see the history of present illness.    All other systems reviewed and are negative.  EKGs/Labs/Other Studies Reviewed:    The following studies were reviewed today:  EKG:  EKG ordered today and personally reviewed.  The ekg ordered today demonstrates sinus rhythm normal EKG no PVCs  Recent Labs: 01/22/2021: Hemoglobin 14.6; Platelets 263.0; TSH 3.40  Recent Lipid Panel 11/10/2020 cholesterol 161 LDL 78 triglycerides 260 HDL 48 A1c 6.7%  Physical Exam:    VS:  BP (!) 150/82   Pulse 72   Ht 5' 8.6" (1.742 m)   Wt 224 lb (101.6 kg)   SpO2 96%   BMI 33.47 kg/m     Wt Readings from Last 3 Encounters:  02/11/21 224 lb (101.6 kg)  01/22/21 224 lb (101.6 kg)  11/12/19 229 lb (103.9 kg)     GEN:  Well nourished, well developed in no acute distress HEENT: Normal NECK: No JVD; No carotid bruits LYMPHATICS: No lymphadenopathy CARDIAC: RRR, no murmurs, rubs, gallops RESPIRATORY:  Clear to auscultation without rales, wheezing or rhonchi  ABDOMEN: Soft, non-tender, non-distended MUSCULOSKELETAL:  No edema; No deformity  SKIN: Warm and dry NEUROLOGIC:  Alert and oriented x 3 PSYCHIATRIC:  Normal affect    Signed, Norman Herrlich, MD  02/11/2021 2:20 PM    [facility]

## 2021-02-11 ENCOUNTER — Encounter: Payer: Self-pay | Admitting: Cardiology

## 2021-02-11 ENCOUNTER — Ambulatory Visit (INDEPENDENT_AMBULATORY_CARE_PROVIDER_SITE_OTHER): Payer: 59 | Admitting: Cardiology

## 2021-02-11 ENCOUNTER — Other Ambulatory Visit: Payer: Self-pay

## 2021-02-11 VITALS — BP 150/82 | HR 72 | Ht 68.6 in | Wt 224.0 lb

## 2021-02-11 DIAGNOSIS — E782 Mixed hyperlipidemia: Secondary | ICD-10-CM

## 2021-02-11 DIAGNOSIS — I10 Essential (primary) hypertension: Secondary | ICD-10-CM | POA: Diagnosis not present

## 2021-02-11 DIAGNOSIS — I493 Ventricular premature depolarization: Secondary | ICD-10-CM | POA: Diagnosis not present

## 2021-02-11 DIAGNOSIS — I2510 Atherosclerotic heart disease of native coronary artery without angina pectoris: Secondary | ICD-10-CM

## 2021-02-11 MED ORDER — AMLODIPINE BESYLATE 5 MG PO TABS: 5.0000 mg | ORAL_TABLET | Freq: Every day | ORAL | 3 refills | Status: DC

## 2021-02-11 MED ORDER — CELECOXIB 200 MG PO CAPS: 200.0000 mg | ORAL_CAPSULE | Freq: Two times a day (BID) | ORAL | 3 refills | Status: AC

## 2021-02-11 MED ORDER — HYDROCHLOROTHIAZIDE 25 MG PO TABS: 25.0000 mg | ORAL_TABLET | Freq: Every day | ORAL | 3 refills | Status: DC

## 2021-02-11 MED ORDER — ACEBUTOLOL HCL 400 MG PO CAPS: 400.0000 mg | ORAL_CAPSULE | Freq: Two times a day (BID) | ORAL | 3 refills | Status: DC

## 2021-02-11 MED ORDER — LOSARTAN POTASSIUM 100 MG PO TABS: 100.0000 mg | ORAL_TABLET | Freq: Every day | ORAL | 3 refills | Status: AC

## 2021-02-11 NOTE — Patient Instructions (Signed)
Medication Instructions:  Your physician recommends that you continue on your current medications as directed. Please refer to the Current Medication list given to you today.  *If you need a refill on your cardiac medications before your next appointment, please call your pharmacy*   Lab Work: None If you have labs (blood work) drawn today and your tests are completely normal, you will receive your results only by: . MyChart Message (if you have MyChart) OR . A paper copy in the mail If you have any lab test that is abnormal or we need to change your treatment, we will call you to review the results.   Testing/Procedures: None   Follow-Up: At [facility], you and your health needs are our priority.  As part of our continuing mission to provide you with exceptional heart care, we have created designated Provider Care Teams.  These Care Teams include your primary Cardiologist (physician) and Advanced Practice Providers (APPs -  Physician Assistants and Nurse Practitioners) who all work together to provide you with the care you need, when you need it.  We recommend signing up for the patient portal called "MyChart".  Sign up information is provided on this After Visit Summary.  MyChart is used to connect with patients for Virtual Visits (Telemedicine).  Patients are able to view lab/test results, encounter notes, upcoming appointments, etc.  Non-urgent messages can be sent to your provider as well.   To learn more about what you can do with MyChart, go to [email or web address].    Your next appointment:   1 year(s)  The format for your next appointment:   In Person  Provider:   Brian Munley, MD   Other Instructions 1. Avoid all over-the-counter antihistamines except Claritin/Loratadine and Zyrtec/Cetrizine. 2. Avoid all combination including cold sinus allergies flu decongestant and sleep medications 3. You can use Robitussin DM Mucinex and Mucinex DM for cough. 4. can use  Tylenol aspirin ibuprofen and naproxen but no combinations such as sleep or sinus. 

## 2021-02-13 ENCOUNTER — Other Ambulatory Visit: Payer: Self-pay

## 2021-02-13 ENCOUNTER — Ambulatory Visit
Admission: RE | Admit: 2021-02-13 | Discharge: 2021-02-13 | Disposition: A | Discharge status: Home / Self Care | Payer: 59 | Source: Ambulatory Visit | Attending: Physician Assistant | Admitting: Physician Assistant

## 2021-02-13 DIAGNOSIS — R413 Other amnesia: Secondary | ICD-10-CM

## 2021-02-13 DIAGNOSIS — S060X9D Concussion with loss of consciousness of unspecified duration, subsequent encounter: Secondary | ICD-10-CM

## 2021-02-13 IMAGING — MR MR HEAD WO/W CM
12 series · 48 of 48 positions shown · IV contrast (multihance)
Comparison: [DATE]

CLINICAL DATA: Concussion with memory loss

EXAM:
MRI HEAD WITHOUT AND WITH CONTRAST
TECHNIQUE: Multiplanar, multiecho pulse sequences of the brain and surrounding
structures were obtained without and with intravenous contrast.
CONTRAST:  20mL MULTIHANCE GADOBENATE DIMEGLUMINE 529 MG/ML IV SOLN

[Series 2: T1 · sagittal · 5.0mm · 0.45mm/px · 1 of 25 slices shown]
[im 1/25]
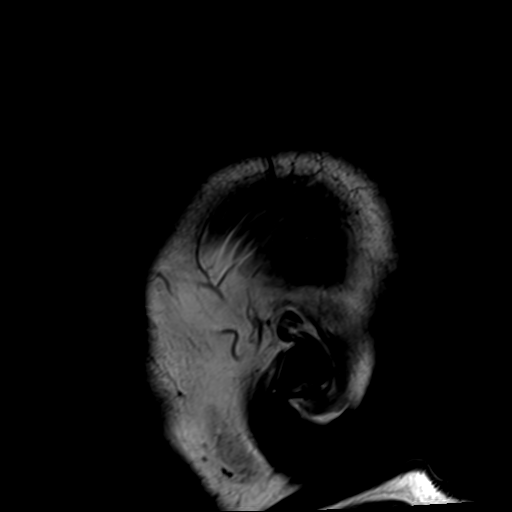

[Series 3: ax ep2d_diff_3 · axial · 3.0mm · 1.80mm/px · z∈[-37,+124]mm · 6 of 110 slices shown]
[im 1/110]
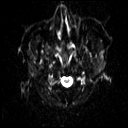
[im 22/110]
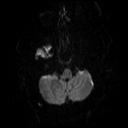
[im 44/110]
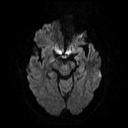
[im 66/110]
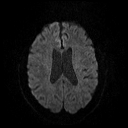
[im 88/110]
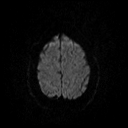
[im 110/110]
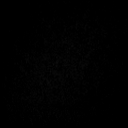

[Series 4: ax ep2d_diff_3_adc · axial · 3.0mm · 1.80mm/px · z∈[-37,+124]mm · 2 of 55 slices shown]
[im 1/55]
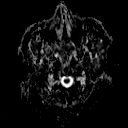
[im 55/55]
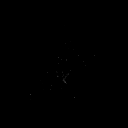

[Series 5: cor ep2d_diff · coronal · 5.0mm · 1.77mm/px · 4 of 60 slices shown]
[im 1/60]
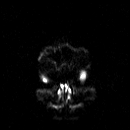
[im 20/60]
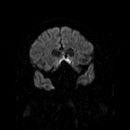
[im 40/60]
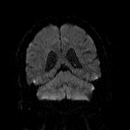
[im 60/60]
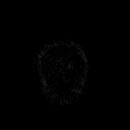

[Series 6: cor ep2d_diff_adc · coronal · 5.0mm · 1.77mm/px · 2 of 30 slices shown]
[im 1/30]
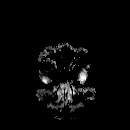
[im 30/30]
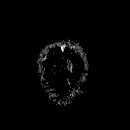

[Series 8: swi_images · axial · 2.0mm · 0.98mm/px · z∈[-47,+110]mm · 5 of 80 slices shown]
[im 1/80]
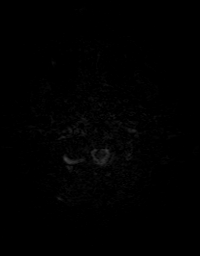
[im 20/80]
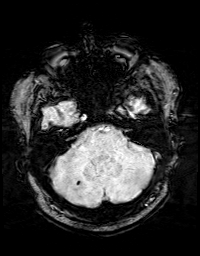
[im 40/80]
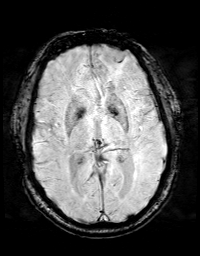
[im 60/80]
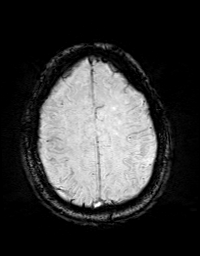
[im 80/80]
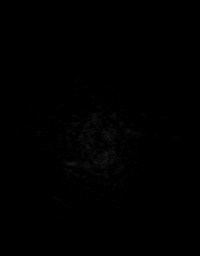

[Series 9: FLAIR · axial · 3.0mm · 0.43mm/px · z∈[-46,+106]mm · 2 of 40 slices shown]
[im 1/40]
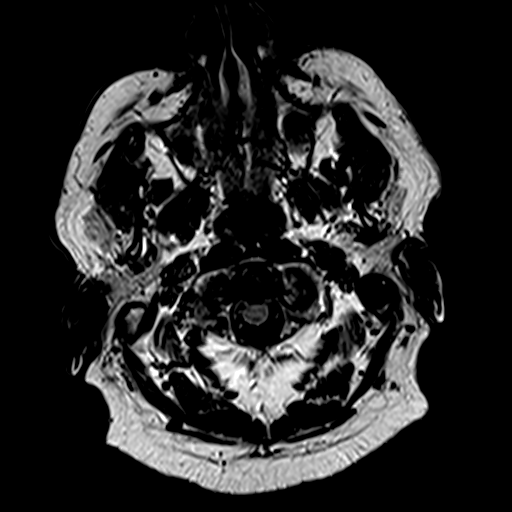
[im 40/40]
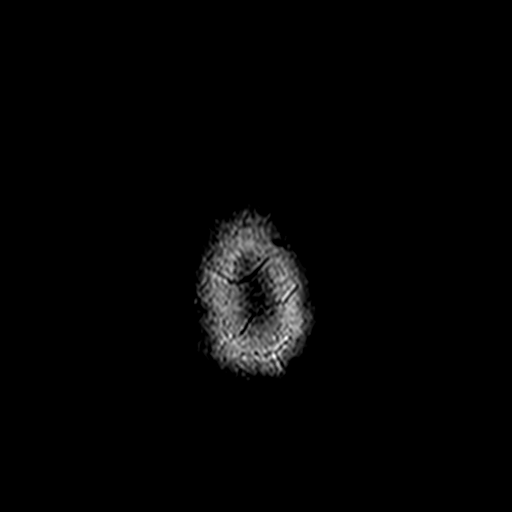

[Series 10: T2 · axial · 5.0mm · 0.65mm/px · z∈[-52,+115]mm · 2 of 29 slices shown (1 of 2)]
[im 1/29]
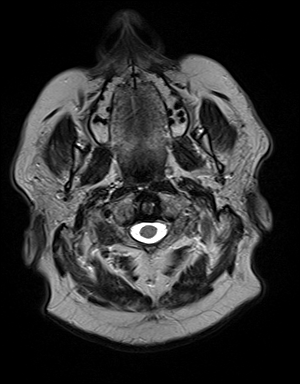
[im 29/29]
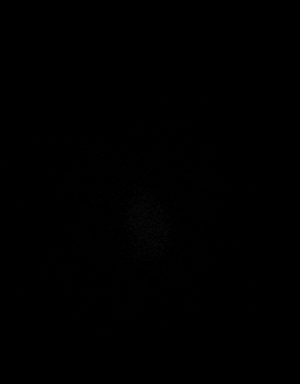

[Series 11: t1_mpr_tra · axial · 1.0mm · 0.72mm/px · z∈[-48,+111]mm · 10 of 160 slices shown]
[im 1/160]
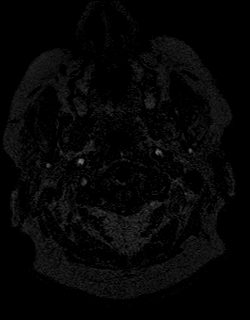
[im 18/160]
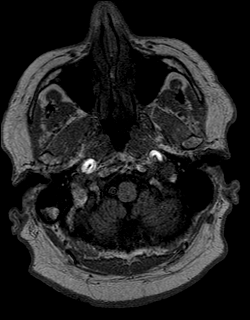
[im 36/160]
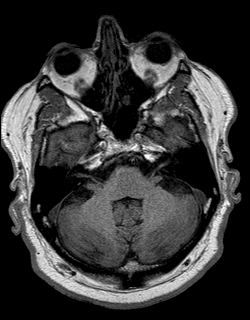
[im 54/160]
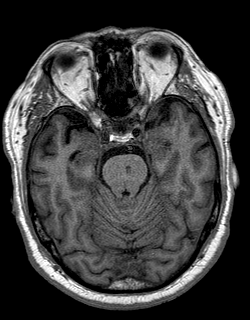
[im 71/160]
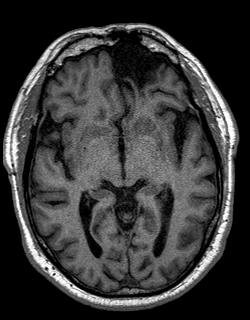
[im 89/160]
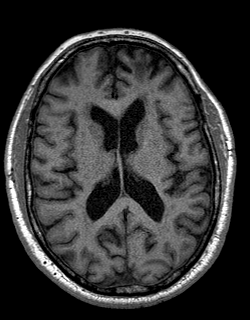
[im 107/160]
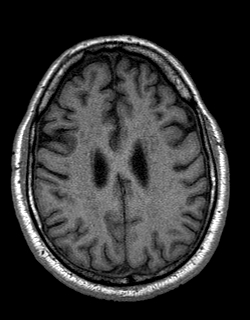
[im 124/160]
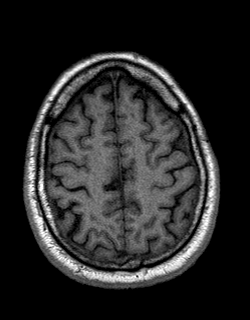
[im 142/160]
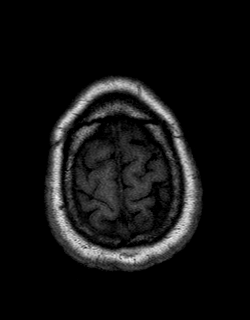
[im 160/160]
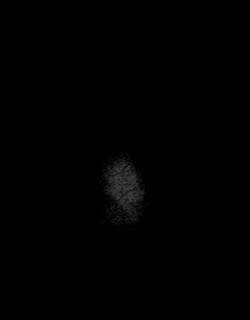

[Series 12: T2 · coronal · 5.0mm · 0.43mm/px · 2 of 28 slices shown (2 of 2)]
[im 1/28]
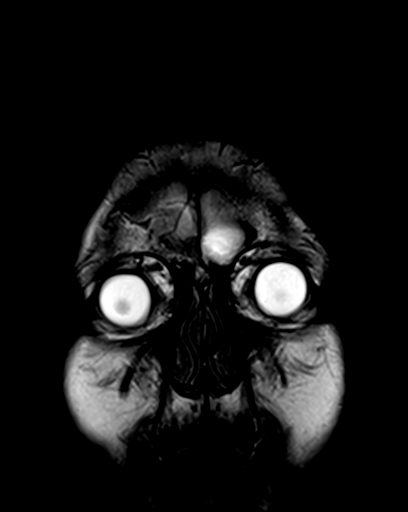
[im 28/28]
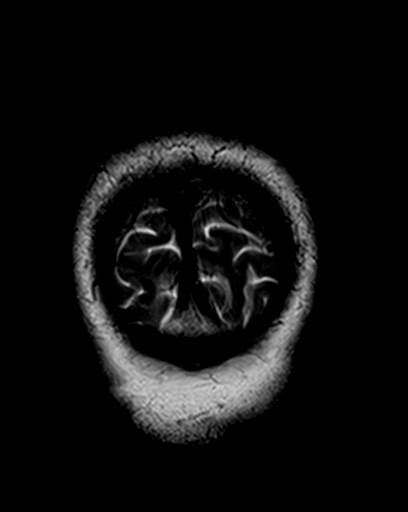

[Series 13: post t1_mpr_tra · axial · 1.0mm · 0.72mm/px · z∈[-48,+111]mm · 10 of 160 slices shown]
[im 1/160]
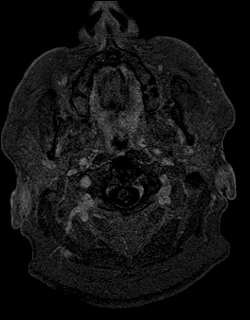
[im 18/160]
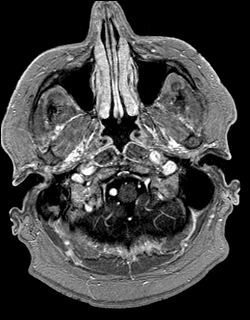
[im 36/160]
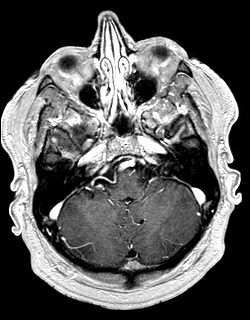
[im 54/160]
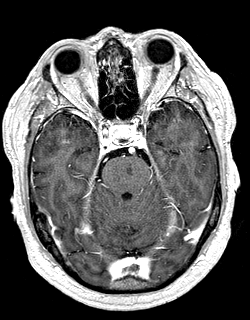
[im 71/160]
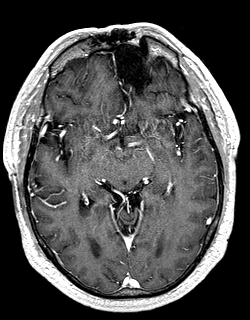
[im 89/160]
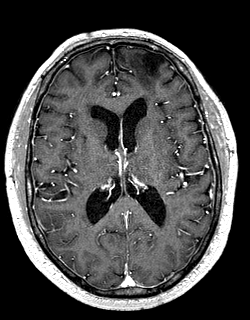
[im 107/160]
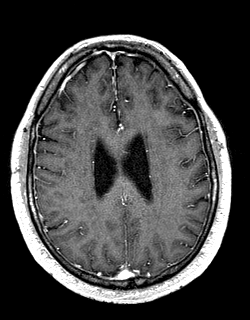
[im 124/160]
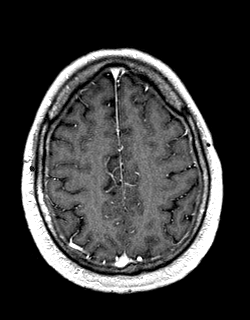
[im 142/160]
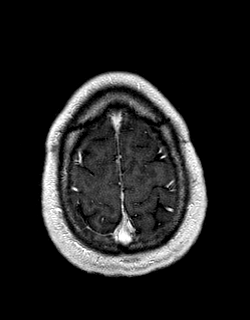
[im 160/160]
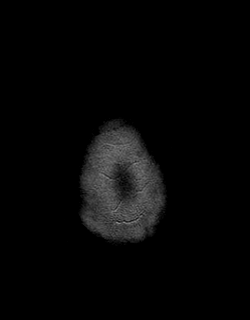

[Series 14: T1 post-contrast · coronal · 5.0mm · 0.72mm/px · 2 of 28 slices shown]
[im 1/28]
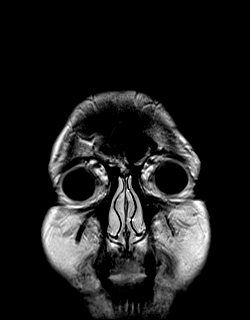
[im 28/28]
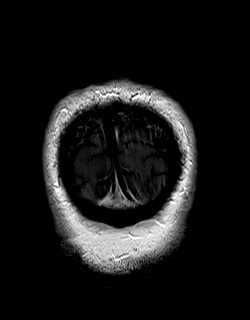

[48 of 48 positions shown; findings below may reference images not displayed]

FINDINGS: Brain: No acute infarct, mass effect or extra-axial collection.
Single focus of chronic microhemorrhage in the right cerebellum
There is multifocal hyperintense T2-weighted signal within the white
matter. Generalized volume loss without a clear lobar predilection.
Unchanged left frontal lobe encephalomalacia. The midline structures
are normal. There is no abnormal contrast enhancement.

Vascular: Major flow voids are preserved.

Skull and upper cervical spine: Normal calvarium and skull base.
Visualized upper cervical spine and soft tissues are normal.

Sinuses/Orbits:No paranasal sinus fluid levels or advanced mucosal
thickening. No mastoid or middle ear effusion. Normal orbits.
IMPRESSION: 1. No acute intracranial abnormality.
2. Unchanged left frontal lobe encephalomalacia and findings of
chronic microvascular disease.

## 2021-02-13 MED ORDER — GADOBENATE DIMEGLUMINE 529 MG/ML IV SOLN
20.0000 mL | Freq: Once | INTRAVENOUS | Status: AC | PRN
  Administered 2021-02-13: 20 mL via INTRAVENOUS

## 2021-02-19 ENCOUNTER — Other Ambulatory Visit: Payer: Self-pay | Admitting: Neurosurgery

## 2021-02-19 DIAGNOSIS — G9519 Other vascular myelopathies: Secondary | ICD-10-CM

## 2021-02-23 ENCOUNTER — Ambulatory Visit (INDEPENDENT_AMBULATORY_CARE_PROVIDER_SITE_OTHER): Payer: 59 | Admitting: Neurology

## 2021-02-23 ENCOUNTER — Other Ambulatory Visit: Payer: Self-pay

## 2021-02-23 DIAGNOSIS — S060X9D Concussion with loss of consciousness of unspecified duration, subsequent encounter: Secondary | ICD-10-CM | POA: Diagnosis not present

## 2021-02-23 DIAGNOSIS — R413 Other amnesia: Secondary | ICD-10-CM

## 2021-02-23 DIAGNOSIS — M5417 Radiculopathy, lumbosacral region: Secondary | ICD-10-CM

## 2021-02-23 NOTE — Procedures (Signed)
[facility]  [address] [place], [address]  [place], [place] [address] Tel: [phone] Fax:  [phone] Test Date:  02/23/2021  Patient: Gordon Roy DOB: 14-Nov-1960 Physician: Nita Sickle, DO  Sex: Male Height: 5\' 8"  Ref Phys: , PA-C  ID#: Marlowe Kays   Technician:    Patient Complaints: This is a 60 year old man referred for evaluation of bilateral feet paresthesias and gait unsteadiness.  NCV & EMG Findings: Extensive electrodiagnostic testing of the right lower extremity and additional studies of the left shows:  Bilateral sural and superficial peroneal sensory responses are within normal limits. Left peroneal motor response at the extensor digitorum brevis is absent, and normal at the tibialis anterior.  Right peroneal and bilateral tibial motor responses are within normal limits. Bilateral tibial H reflex studies are within normal limits. Chronic motor axonal loss changes are seen affecting the left L5 myotome, without accompanied active denervation.  These findings are not present in the right lower extremity.  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 There is no evidence of a sensorimotor polyneuropathy affecting the lower extremities   ___________________________ 41, DO    Nerve Conduction Studies Anti Sensory Summary Table   Stim Site NR Peak (ms) Norm Peak (ms) P-T Amp (V) Norm P-T Amp  Left Sup Peroneal Anti Sensory (Ant Lat Mall)  33C  12 cm    2.3 <4 13.0 >6  Right Sup Peroneal Anti Sensory (Ant Lat Mall)  33C  12 cm    2.6 <4 10.7 >6  Left Sural Anti Sensory (Lat Mall)  33C  Calf    4.3 <4.4 11.5 >6  Right Sural Anti Sensory (Lat Mall)  33C  Calf    3.1 <4.4 14.8 >6   Motor Summary Table   Stim Site NR Onset (ms) Norm Onset (ms) O-P Amp (mV) Norm O-P Amp Site1 Site2 Delta-0 (ms) Dist (cm) Vel (m/s) Norm Vel (m/s)  Left Peroneal Motor (Ext Dig Brev)  33C  Ankle NR  <5.5  >2 B  Fib Ankle  0.0  >41  B Fib NR     Poplt B Fib  0.0  >41  Poplt NR            Right Peroneal Motor (Ext Dig Brev)  33C  Ankle    3.4 <5.5 4.4 >2 B Fib Ankle 7.9 39.0 49 >41  B Fib    11.3  4.3  Poplt B Fib 1.8 8.0 44 >41  Poplt    13.1  3.7         Left Peroneal TA Motor (Tib Ant)  33C  Fib Head    2.7 <4.2 7.3  Poplit Fib Head 1.5 8.0 53 >40.5  Poplit    4.2 <5.7 7.1         Right Peroneal TA Motor (Tib Ant)  33C  Fib Head    2.5 <4.2 6.8  Poplit Fib Head 1.4 8.0 57 >40.5  Poplit    3.9 <5.7 6.6         Left Tibial Motor (Abd Hall Brev)  33C  Ankle    3.5 <6.1 9.6 >3.0 Knee Ankle 9.5 40.0 42 >41  Knee    13.0  7.0         Right Tibial Motor (Abd Hall Brev)  33C  Ankle    3.6 <6.1 7.0 >3.0 Knee Ankle 9.3 43.0 46 >41  Knee    12.9  5.6  H Reflex Studies   NR H-Lat (ms) Lat Norm (ms) L-R H-Lat (ms)  Left Tibial (Gastroc)  33C     34.42 <35 0.41  Right Tibial (Gastroc)  33C     34.83 <35 0.41   EMG   Side Muscle Ins Act Fibs Psw Fasc Number Recrt Dur Dur. Amp Amp. Poly Poly. Comment  Left AntTibialis Nml Nml Nml Nml 1- Rapid Some 1+ Some 1+ Some 1+ N/A  Left Gastroc Nml Nml Nml Nml Nml Nml Nml Nml Nml Nml Nml Nml N/A  Left Flex Dig Long Nml Nml Nml Nml 2- Rapid Many 1+ Many 1+ Many 1+ N/A  Left RectFemoris Nml Nml Nml Nml Nml Nml Nml Nml Nml Nml Nml Nml N/A  Left GluteusMed Nml Nml Nml Nml 1- Rapid Some 1+ Some 1+ Some 1+ N/A  Right Flex Dig Long Nml Nml Nml Nml Nml Nml Nml Nml Nml Nml Nml Nml N/A  Right AntTibialis Nml Nml Nml Nml Nml Nml Nml Nml Nml Nml Nml Nml N/A  Right Gastroc Nml Nml Nml Nml Nml Nml Nml Nml Nml Nml Nml Nml N/A  Right RectFemoris Nml Nml Nml Nml Nml Nml Nml Nml Nml Nml Nml Nml N/A  Right GluteusMed Nml Nml Nml Nml Nml Nml Nml Nml Nml Nml Nml Nml N/A      Waveforms:

## 2021-02-25 ENCOUNTER — Telehealth: Payer: Self-pay | Admitting: Physician Assistant

## 2021-02-25 NOTE — Telephone Encounter (Signed)
Pt called in to get his MRI results 

## 2021-02-27 ENCOUNTER — Ambulatory Visit
Admission: RE | Admit: 2021-02-27 | Discharge: 2021-02-27 | Disposition: A | Discharge status: Home / Self Care | Payer: 59 | Source: Ambulatory Visit | Attending: Neurosurgery | Admitting: Neurosurgery

## 2021-02-27 DIAGNOSIS — G9519 Other vascular myelopathies: Secondary | ICD-10-CM

## 2021-02-27 IMAGING — MR MR LUMBAR SPINE W/O CM
4 of 5 series · 25 of 48 positions shown · non-contrast
Comparison: [DATE]

CLINICAL DATA: Weakness in bilateral legs when walking

EXAM:
MRI LUMBAR SPINE WITHOUT CONTRAST
TECHNIQUE: Multiplanar, multisequence MR imaging of the lumbar spine was
performed. No intravenous contrast was administered.

[Series 2: T2 · sagittal · 4.0mm · 0.53mm/px · 6 of 14 slices shown (1 of 2)]
[im 1/14]
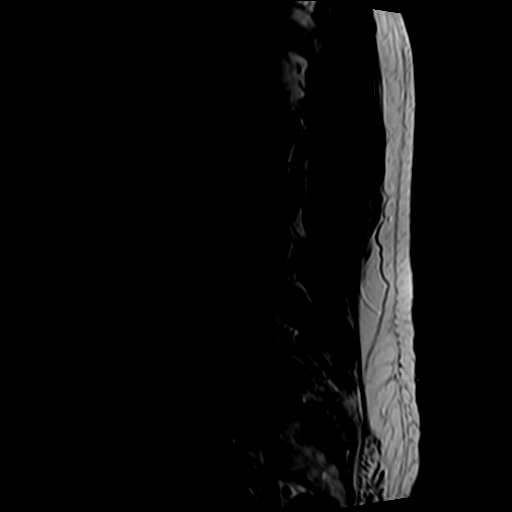
[im 3/14]
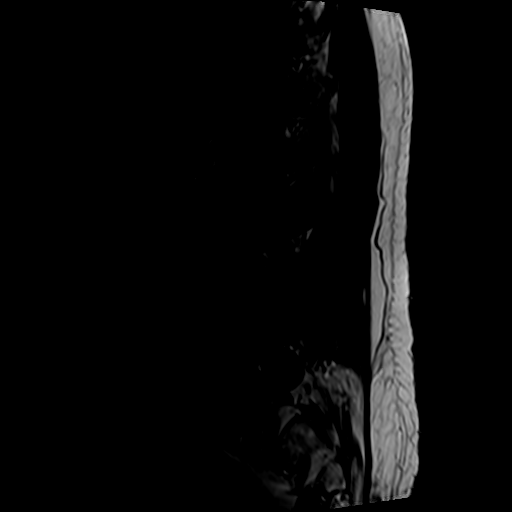
[im 6/14]
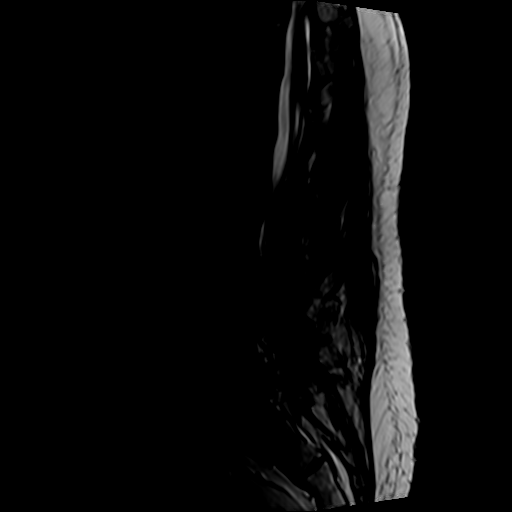
[im 8/14]
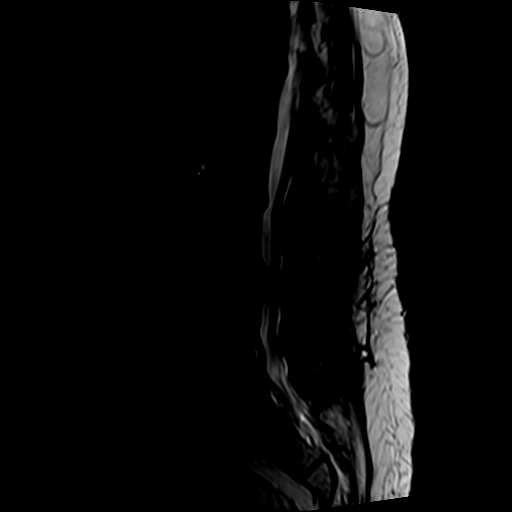
[im 11/14]
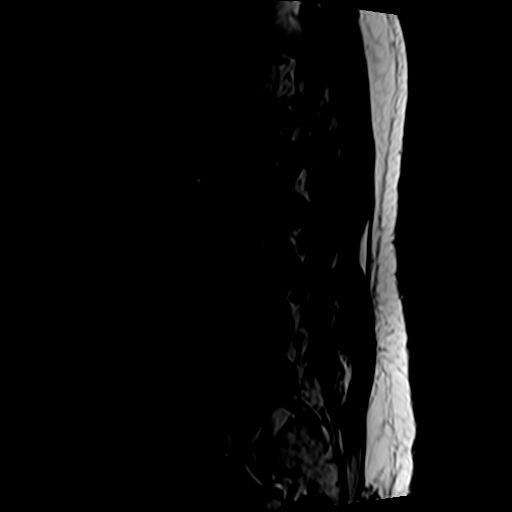
[im 14/14]
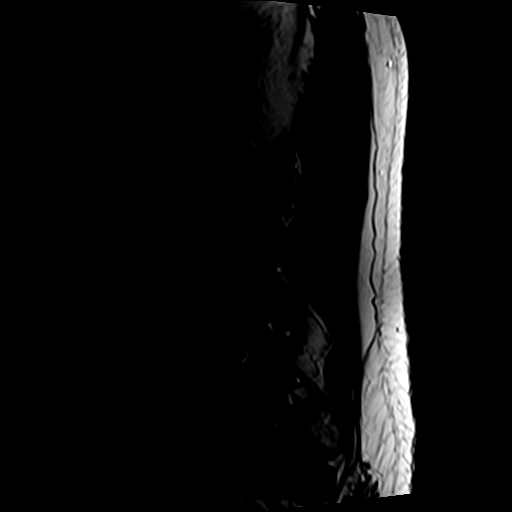

[Series 4: T1 · sagittal · 4.0mm · 0.53mm/px · 6 of 14 slices shown (1 of 2)]
[im 1/14]
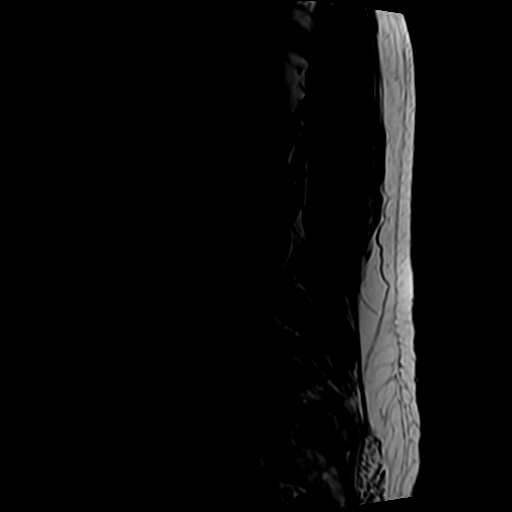
[im 3/14]
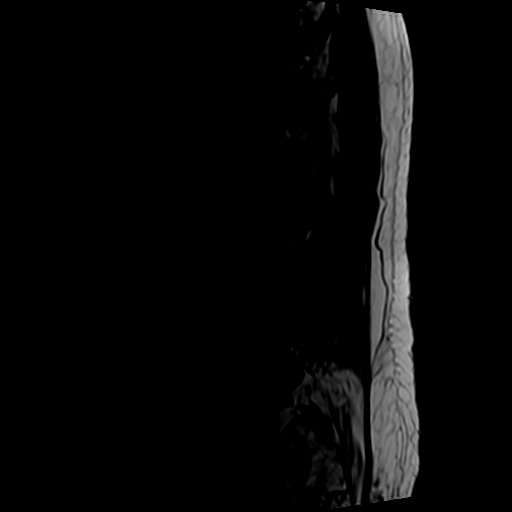
[im 6/14]
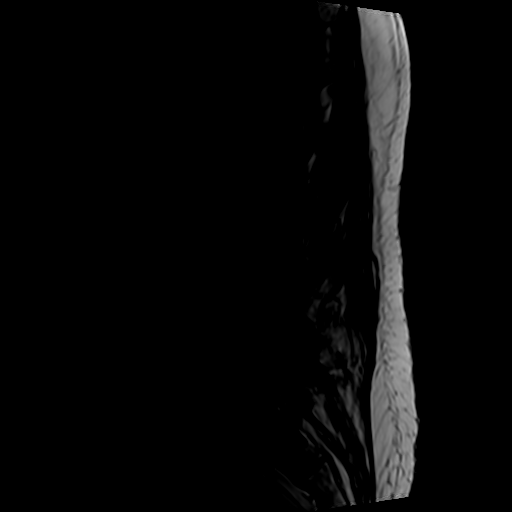
[im 8/14]
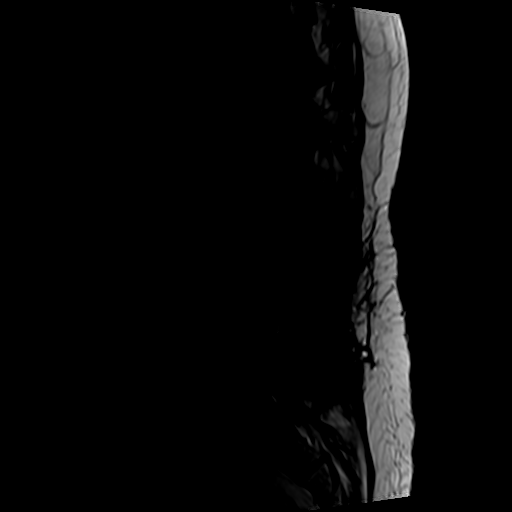
[im 11/14]
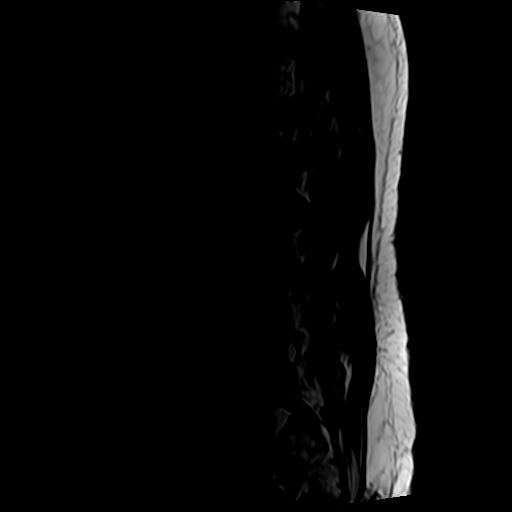
[im 14/14]
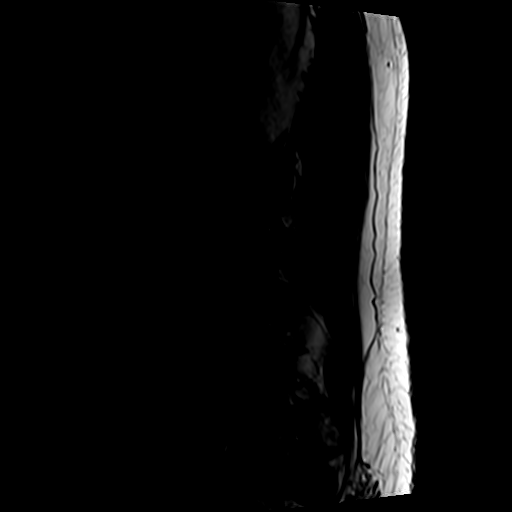

[Series 5: T2 · axial · 4.0mm · 0.70mm/px · z∈[-105,+103]mm · 9 of 36 slices shown (2 of 2)]
[im 1/36]
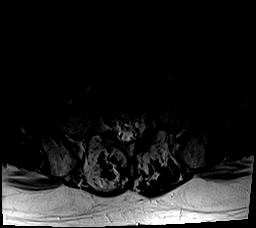
[im 6/36]
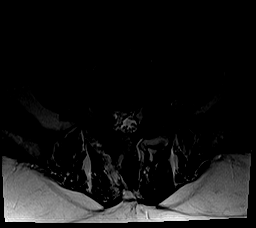
[im 11/36]
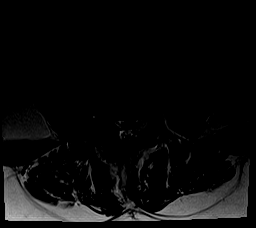
[im 16/36]
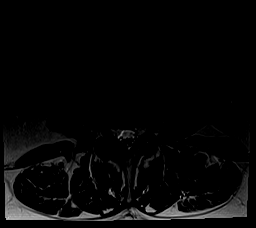
[im 18/36]
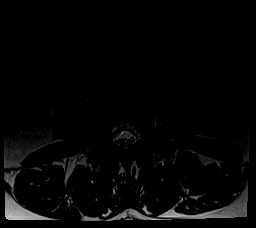
[im 21/36]
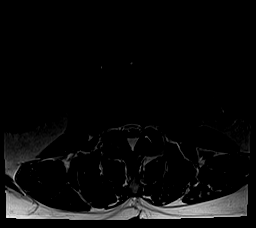
[im 26/36]
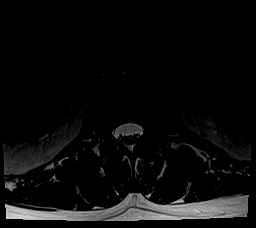
[im 31/36]
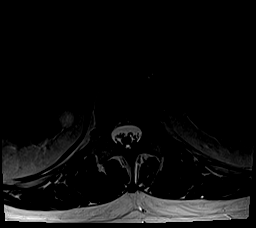
[im 36/36]
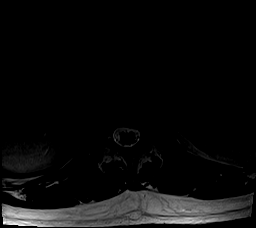

[Series 6: T1 · axial · 4.0mm · 0.35mm/px · z∈[-105,+77]mm · 4 of 36 slices shown (2 of 2)]
[im 1/36]
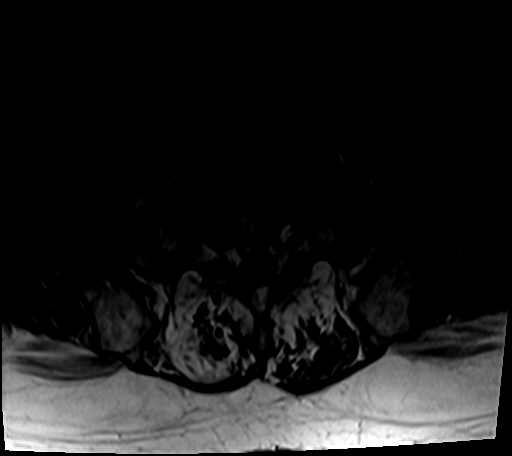
[im 6/36]
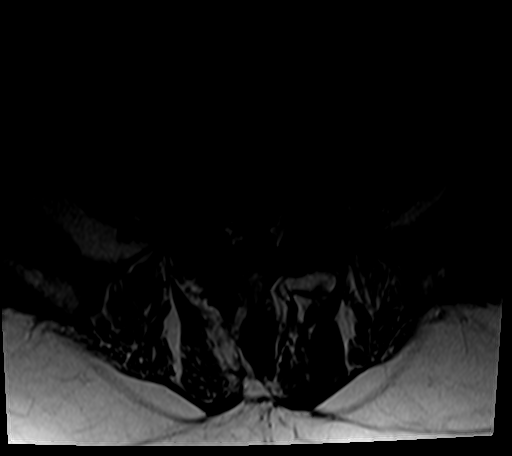
[im 18/36]
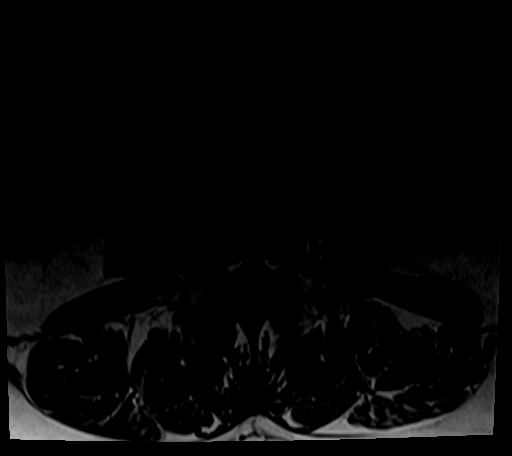
[im 31/36]
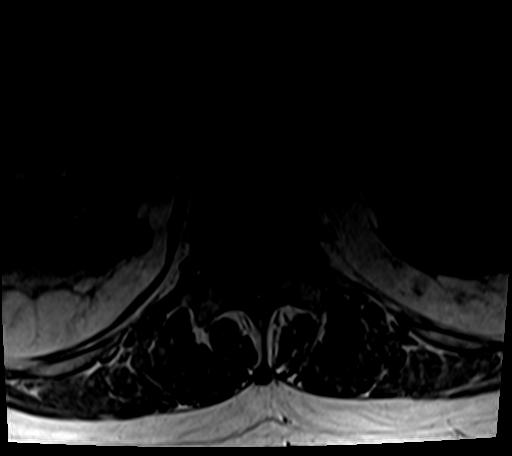

[25 of 48 positions shown; findings below may reference images not displayed]

FINDINGS: Segmentation:  5 lumbar type vertebral bodies.

Alignment:  No significant listhesis.  Mild levocurvature

Vertebrae: Status post right L3-L4 and L4-L5 hemilaminectomy and
bilateral L5-S1 hemilaminectomy. No acute fracture or suspicious
osseous lesion.

Conus medullaris and cauda equina: Conus extends to the L1-L2 level.
Conus and cauda equina appear normal.

Paraspinal and other soft tissues: Bilateral renal cysts. Aortic
atherosclerosis. Atrophy of the inferior paraspinous muscles.

Disc levels:

T12-L1: Small left paracentral disc protrusion. No spinal canal
stenosis or neural foraminal narrowing.

L1-L2: No significant disc bulge. No spinal canal stenosis or neural
foraminal narrowing.

L2-L3: Disc height loss and mild disc bulge with superimposed
bilateral foraminal protrusions. Mild facet arthropathy. No spinal
canal stenosis. No neural foraminal narrowing.

L3-L4: Disc height loss with right greater than left disc bulge and
superimposed central extrusion with 6 mm of caudal migration.
Effacement of the lateral recesses, right greater than left.
Moderate to severe spinal canal stenosis, which has progressed from
the prior exam. Mild right neural foraminal narrowing.

L4-L5: Status post discectomy for previously noted disc extrusion.
Focal fat is noted at the left aspect of the thecal sac (series 5,
image 26), which causes mild spinal canal stenosis superior to the
L4-L5 disc space and likely compresses the descending left L4 nerve.
Severe disc height loss with moderate disc bulge. Severe right and
moderate left facet arthropathy. No spinal canal stenosis at the
L4-L5 disc space. Moderate to severe right and mild left neural
foraminal narrowing.

L5-S1: Disc height loss with moderate disc bulge with focal left
paracentral defect, status post discectomy. Moderate facet
arthropathy. Status post decompression. No spinal canal stenosis.
Severe left and mild right neural foraminal narrowing
IMPRESSION: 1. L3-L4 moderate to severe spinal canal stenosis, secondary to disc
bulge with superimposed central extrusion. The lateral recesses are
effaced at this level, likely compressing the descending L4 nerve
roots, right greater than left. There is also mild right neural
foraminal narrowing at this level.
2. Postsurgical changes at L4-L5, with focal fat noted at the left
aspect of the thecal sac, possibly postsurgical changes, which
causes mild spinal canal stenosis superior to the L4-L5 disc space
and likely compresses the descending left L4 nerve. There is also
moderate to severe right and mild left neural foraminal narrowing
L4-L5.
3. L5-S1 severe left and mild right neural foraminal narrowing.

## 2021-03-01 NOTE — Telephone Encounter (Signed)
Patient advised and voiced understanding of mri results

## 2021-03-01 NOTE — Telephone Encounter (Signed)
Patient called again for MRI results. °

## 2021-03-09 ENCOUNTER — Encounter: Payer: 59 | Admitting: Psychology

## 2021-03-12 ENCOUNTER — Encounter: Payer: 59 | Admitting: Psychology

## 2021-04-20 ENCOUNTER — Ambulatory Visit: Payer: 59 | Admitting: Physician Assistant

## 2023-04-04 ENCOUNTER — Telehealth: Payer: Self-pay | Admitting: Genetic Counselor

## 2023-04-04 ENCOUNTER — Encounter: Payer: Self-pay | Admitting: Genetic Counselor

## 2023-04-04 NOTE — Telephone Encounter (Signed)
Wife Rosey Bath inquired about genetics appt for patient when we were disclosing her genetic testing results, as she's concerned about their daughter's cancer risk.  Rosey Bath reported that Mr. Wilmeth's other daughter had breast cancer before age 60.  He also has an extensive family history of colon cancer, including his sister at a young age who is now deceased.  Scheduled for 1/3 at 1pm at [facility].

## 2023-04-28 ENCOUNTER — Inpatient Hospital Stay: Payer: 59 | Attending: Hematology and Oncology | Admitting: Genetic Counselor

## 2023-04-28 ENCOUNTER — Inpatient Hospital Stay: Payer: 59

## 2023-08-08 ENCOUNTER — Observation Stay (HOSPITAL_COMMUNITY)

## 2023-08-08 ENCOUNTER — Emergency Department (HOSPITAL_COMMUNITY)

## 2023-08-08 ENCOUNTER — Observation Stay (HOSPITAL_COMMUNITY)
Admission: EM | Admit: 2023-08-08 | Discharge: 2023-08-09 | Disposition: A | Discharge status: Home / Self Care | Attending: Internal Medicine | Admitting: Internal Medicine

## 2023-08-08 ENCOUNTER — Encounter (HOSPITAL_COMMUNITY): Payer: Self-pay

## 2023-08-08 ENCOUNTER — Other Ambulatory Visit: Payer: Self-pay

## 2023-08-08 DIAGNOSIS — Z7902 Long term (current) use of antithrombotics/antiplatelets: Secondary | ICD-10-CM | POA: Diagnosis not present

## 2023-08-08 DIAGNOSIS — E785 Hyperlipidemia, unspecified: Secondary | ICD-10-CM | POA: Diagnosis not present

## 2023-08-08 DIAGNOSIS — I63539 Cerebral infarction due to unspecified occlusion or stenosis of unspecified posterior cerebral artery: Secondary | ICD-10-CM | POA: Insufficient documentation

## 2023-08-08 DIAGNOSIS — Z79899 Other long term (current) drug therapy: Secondary | ICD-10-CM | POA: Diagnosis not present

## 2023-08-08 DIAGNOSIS — F32A Depression, unspecified: Secondary | ICD-10-CM | POA: Insufficient documentation

## 2023-08-08 DIAGNOSIS — E039 Hypothyroidism, unspecified: Secondary | ICD-10-CM | POA: Insufficient documentation

## 2023-08-08 DIAGNOSIS — L930 Discoid lupus erythematosus: Secondary | ICD-10-CM | POA: Diagnosis not present

## 2023-08-08 DIAGNOSIS — I639 Cerebral infarction, unspecified: Secondary | ICD-10-CM | POA: Insufficient documentation

## 2023-08-08 DIAGNOSIS — I700 Atherosclerosis of aorta: Secondary | ICD-10-CM | POA: Insufficient documentation

## 2023-08-08 DIAGNOSIS — E1165 Type 2 diabetes mellitus with hyperglycemia: Secondary | ICD-10-CM | POA: Insufficient documentation

## 2023-08-08 DIAGNOSIS — I63531 Cerebral infarction due to unspecified occlusion or stenosis of right posterior cerebral artery: Secondary | ICD-10-CM

## 2023-08-08 DIAGNOSIS — I10 Essential (primary) hypertension: Secondary | ICD-10-CM

## 2023-08-08 DIAGNOSIS — I2510 Atherosclerotic heart disease of native coronary artery without angina pectoris: Secondary | ICD-10-CM | POA: Diagnosis not present

## 2023-08-08 DIAGNOSIS — G459 Transient cerebral ischemic attack, unspecified: Principal | ICD-10-CM | POA: Insufficient documentation

## 2023-08-08 DIAGNOSIS — R569 Unspecified convulsions: Secondary | ICD-10-CM | POA: Diagnosis not present

## 2023-08-08 DIAGNOSIS — Z7982 Long term (current) use of aspirin: Secondary | ICD-10-CM | POA: Insufficient documentation

## 2023-08-08 DIAGNOSIS — R42 Dizziness and giddiness: Secondary | ICD-10-CM | POA: Diagnosis present

## 2023-08-08 HISTORY — DX: Transient cerebral ischemic attack, unspecified: G45.9

## 2023-08-08 LAB — DIFFERENTIAL
Abs Immature Granulocytes: 0.06 10*3/uL (ref 0.00–0.07)
Basophils Absolute: 0.1 10*3/uL (ref 0.0–0.1)
Basophils Relative: 1 %
Eosinophils Absolute: 0.1 10*3/uL (ref 0.0–0.5)
Eosinophils Relative: 1 %
Immature Granulocytes: 1 %
Lymphocytes Relative: 9 %
Lymphs Abs: 1.1 10*3/uL (ref 0.7–4.0)
Monocytes Absolute: 0.9 10*3/uL (ref 0.1–1.0)
Monocytes Relative: 8 %
Neutro Abs: 9.9 10*3/uL — ABNORMAL HIGH (ref 1.7–7.7)
Neutrophils Relative %: 80 %

## 2023-08-08 LAB — COMPREHENSIVE METABOLIC PANEL WITH GFR
ALT: 21 U/L (ref 0–44)
AST: 20 U/L (ref 15–41)
Albumin: 4.3 g/dL (ref 3.5–5.0)
Alkaline Phosphatase: 80 U/L (ref 38–126)
Anion gap: 11 (ref 5–15)
BUN: 12 mg/dL (ref 8–23)
CO2: 23 mmol/L (ref 22–32)
Calcium: 9.3 mg/dL (ref 8.9–10.3)
Chloride: 103 mmol/L (ref 98–111)
Creatinine, Ser: 0.82 mg/dL (ref 0.61–1.24)
GFR, Estimated: >60 mL/min (ref >60)
Glucose, Bld: 182 mg/dL — ABNORMAL HIGH (ref 70–99)
Potassium: 3.6 mmol/L (ref 3.5–5.1)
Sodium: 137 mmol/L (ref 135–145)
Total Bilirubin: 0.8 mg/dL (ref 0.0–1.2)
Total Protein: 7.9 g/dL (ref 6.5–8.1)

## 2023-08-08 LAB — CBC
HCT: 50.6 % (ref 39.0–52.0)
Hemoglobin: 17.4 g/dL — ABNORMAL HIGH (ref 13.0–17.0)
MCH: 31.2 pg (ref 26.0–34.0)
MCHC: 34.4 g/dL (ref 30.0–36.0)
MCV: 90.8 fL (ref 80.0–100.0)
Platelets: 264 10*3/uL (ref 150–400)
RBC: 5.57 MIL/uL (ref 4.22–5.81)
RDW: 12.6 % (ref 11.5–15.5)
WBC: 12.2 10*3/uL — ABNORMAL HIGH (ref 4.0–10.5)
nRBC: 0 % (ref 0.0–0.2)

## 2023-08-08 LAB — I-STAT CHEM 8, ED
BUN: 13 mg/dL (ref 8–23)
Calcium, Ion: 1.11 mmol/L — ABNORMAL LOW (ref 1.15–1.40)
Chloride: 103 mmol/L (ref 98–111)
Creatinine, Ser: 0.8 mg/dL (ref 0.61–1.24)
Glucose, Bld: 181 mg/dL — ABNORMAL HIGH (ref 70–99)
HCT: 51 % (ref 39.0–52.0)
Hemoglobin: 17.3 g/dL — ABNORMAL HIGH (ref 13.0–17.0)
Potassium: 3.7 mmol/L (ref 3.5–5.1)
Sodium: 139 mmol/L (ref 135–145)
TCO2: 24 mmol/L (ref 22–32)

## 2023-08-08 LAB — ETHANOL: Alcohol, Ethyl (B): <10 mg/dL (ref <10)

## 2023-08-08 LAB — HIV ANTIBODY (ROUTINE TESTING W REFLEX): HIV Screen 4th Generation wRfx: NONREACTIVE

## 2023-08-08 LAB — PROTIME-INR
INR: 1 (ref 0.8–1.2)
Prothrombin Time: 13.1 s (ref 11.4–15.2)

## 2023-08-08 LAB — HEMOGLOBIN A1C
Hgb A1c MFr Bld: 6.8 % — ABNORMAL HIGH (ref 4.8–5.6)
Mean Plasma Glucose: 148.46 mg/dL

## 2023-08-08 LAB — APTT: aPTT: 33 s (ref 24–36)

## 2023-08-08 MED ORDER — MIDAZOLAM HCL 2 MG/2ML IJ SOLN
1.0000 mg | Freq: Once | INTRAMUSCULAR | Status: AC | PRN
  Administered 2023-08-08: 1 mg via INTRAVENOUS
  Filled 2023-08-08: qty 2

## 2023-08-08 MED ORDER — CLOPIDOGREL BISULFATE 75 MG PO TABS
75.0000 mg | ORAL_TABLET | Freq: Every day | ORAL | Status: DC
Start: 2023-08-09 — End: 2023-08-09
  Administered 2023-08-09: 75 mg via ORAL
  Filled 2023-08-08: qty 1

## 2023-08-08 MED ORDER — ROSUVASTATIN CALCIUM 20 MG PO TABS
40.0000 mg | ORAL_TABLET | Freq: Every day | ORAL | Status: DC
Start: 2023-08-09 — End: 2023-08-09

## 2023-08-08 MED ORDER — IOHEXOL 350 MG/ML SOLN
75.0000 mL | Freq: Once | INTRAVENOUS | Status: AC | PRN
  Administered 2023-08-08: 75 mL via INTRAVENOUS

## 2023-08-08 MED ORDER — CLOPIDOGREL BISULFATE 300 MG PO TABS
300.0000 mg | ORAL_TABLET | Freq: Once | ORAL | Status: AC
  Administered 2023-08-08: 300 mg via ORAL
  Filled 2023-08-08: qty 1

## 2023-08-08 MED ORDER — GADOBUTROL 1 MMOL/ML IV SOLN
10.0000 mL | Freq: Once | INTRAVENOUS | Status: AC | PRN
  Administered 2023-08-08: 10 mL via INTRAVENOUS

## 2023-08-08 MED ORDER — ROSUVASTATIN CALCIUM 20 MG PO TABS: 40.0000 mg | ORAL_TABLET | Freq: Every day | ORAL | Status: DC

## 2023-08-08 MED ORDER — SODIUM CHLORIDE 0.9% FLUSH
3.0000 mL | Freq: Once | INTRAVENOUS | Status: AC
  Administered 2023-08-08: 3 mL via INTRAVENOUS

## 2023-08-08 MED ORDER — CLOPIDOGREL BISULFATE 300 MG PO TABS
300.0000 mg | ORAL_TABLET | Freq: Every day | ORAL | Status: DC
Start: 2023-08-08 — End: 2023-08-08

## 2023-08-08 NOTE — ED Notes (Signed)
 CCMD notified for central monitoring

## 2023-08-08 NOTE — Code Documentation (Signed)
 Stroke Response Nurse Documentation Code Documentation  Gordon Roy is a 63 y.o. male arriving to [facility]  via [facility] on 08/08/2023 with past medical hx of HTN, TBI DM. On aspirin 81 mg daily. Code stroke was activated by EMS.   Patient from home where he was LKW at 0800 and now complaining of H/A dizziness diplopia. Pt was driving to work and had to pull over because he was having double vision.  Stroke team at the bedside on patient arrival. Labs drawn and patient cleared for CT by EDP Patient to CT with team. NIHSS 0, see documentation for details and code stroke times. Patient with no deficits on exam. The following imaging was completed:  CT Head and CTA. Patient is not a candidate for IV Thrombolytic due to too mild to treat. Patient is not a candidate for IR due to no LVO.  Care Plan:   q 30 NIHSS, VS till 1230, then q 2  NPO until passes stroke swallow screen.   Process Delays Noted: N/A  Bedside handoff with ED RN Gordon Roy.    Gordon Roy  Stroke Response RN
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
## 2023-08-08 NOTE — Hospital Course (Signed)
 Dirving car and got dizzy, blurred vision, resolving. MRI
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
## 2023-08-08 NOTE — Progress Notes (Signed)
EEG complete. Results pending.  ?

## 2023-08-08 NOTE — ED Triage Notes (Signed)
 Sudden onset of dizziness blurred vision at 0800.  Reports double vision as well.  Called 911.

## 2023-08-08 NOTE — Progress Notes (Signed)
Pt admitted from ED with stroke diagnosis, alert and oriented, denies any pain at this time, pt settled in bed with call light at bedside, tele monitor put and verified on pt, was however reassured and will continue to monitor. [name], Gordon Roy

## 2023-08-08 NOTE — Procedures (Signed)
 Patient Name: Gordon Roy  MRN: [id number]  Epilepsy Attending: Arleene Lack  Referring Physician/Provider: Lundy Salisbury, MD  Date: 08/08/2023 Duration: 22.17 mins  Patient history: 63 yo M with acute headache, horizontal diplopia, and dizziness. EEG to evaluate for seizure  Level of alertness: Awake  AEDs during EEG study: None  Technical aspects: This EEG study was done with scalp electrodes positioned according to the 10-20 International system of electrode placement. Electrical activity was reviewed with band pass filter of 1-70Hz , sensitivity of 7 uV/mm, display speed of 34mm/sec with a 60Hz  notched filter applied as appropriate. EEG data were recorded continuously and digitally stored.  Video monitoring was available and reviewed as appropriate.  Description: The posterior dominant rhythm consists of 8-9 Hz activity of moderate voltage (25-35 uV) seen predominantly in posterior head regions, symmetric and reactive to eye opening and eye closing. Hyperventilation and photic stimulation were not performed.     IMPRESSION: This study is within normal limits. No seizures or epileptiform discharges were seen throughout the recording.  A normal interictal EEG does not exclude the diagnosis of epilepsy.  Ronni Osterberg O Gitty Osterlund

## 2023-08-08 NOTE — ED Notes (Signed)
 Lab adding A1C to blood in lab

## 2023-08-09 ENCOUNTER — Other Ambulatory Visit (HOSPITAL_COMMUNITY)

## 2023-08-09 ENCOUNTER — Observation Stay (HOSPITAL_BASED_OUTPATIENT_CLINIC_OR_DEPARTMENT_OTHER)

## 2023-08-09 ENCOUNTER — Other Ambulatory Visit (HOSPITAL_COMMUNITY): Payer: Self-pay

## 2023-08-09 DIAGNOSIS — E785 Hyperlipidemia, unspecified: Secondary | ICD-10-CM | POA: Diagnosis not present

## 2023-08-09 DIAGNOSIS — I10 Essential (primary) hypertension: Secondary | ICD-10-CM | POA: Diagnosis not present

## 2023-08-09 DIAGNOSIS — I63539 Cerebral infarction due to unspecified occlusion or stenosis of unspecified posterior cerebral artery: Secondary | ICD-10-CM | POA: Insufficient documentation

## 2023-08-09 DIAGNOSIS — I63211 Cerebral infarction due to unspecified occlusion or stenosis of right vertebral arteries: Secondary | ICD-10-CM | POA: Diagnosis not present

## 2023-08-09 DIAGNOSIS — E669 Obesity, unspecified: Secondary | ICD-10-CM

## 2023-08-09 DIAGNOSIS — I639 Cerebral infarction, unspecified: Secondary | ICD-10-CM

## 2023-08-09 DIAGNOSIS — Z6831 Body mass index (BMI) 31.0-31.9, adult: Secondary | ICD-10-CM

## 2023-08-09 DIAGNOSIS — F17210 Nicotine dependence, cigarettes, uncomplicated: Secondary | ICD-10-CM

## 2023-08-09 DIAGNOSIS — G459 Transient cerebral ischemic attack, unspecified: Secondary | ICD-10-CM | POA: Diagnosis not present

## 2023-08-09 DIAGNOSIS — R29700 NIHSS score 0: Secondary | ICD-10-CM | POA: Diagnosis not present

## 2023-08-09 DIAGNOSIS — G9389 Other specified disorders of brain: Secondary | ICD-10-CM

## 2023-08-09 DIAGNOSIS — Z87820 Personal history of traumatic brain injury: Secondary | ICD-10-CM

## 2023-08-09 HISTORY — DX: Cerebral infarction due to unspecified occlusion or stenosis of unspecified posterior cerebral artery: I63.539

## 2023-08-09 HISTORY — DX: Cerebral infarction, unspecified: I63.9

## 2023-08-09 LAB — CBC
HCT: 46.5 % (ref 39.0–52.0)
Hemoglobin: 16 g/dL (ref 13.0–17.0)
MCH: 31.1 pg (ref 26.0–34.0)
MCHC: 34.4 g/dL (ref 30.0–36.0)
MCV: 90.5 fL (ref 80.0–100.0)
Platelets: 233 10*3/uL (ref 150–400)
RBC: 5.14 MIL/uL (ref 4.22–5.81)
RDW: 12.7 % (ref 11.5–15.5)
WBC: 8.8 10*3/uL (ref 4.0–10.5)
nRBC: 0 % (ref 0.0–0.2)

## 2023-08-09 LAB — LIPID PANEL
Cholesterol: 168 mg/dL (ref 0–200)
HDL: 36 mg/dL — ABNORMAL LOW (ref >40)
LDL Cholesterol: 83 mg/dL (ref 0–99)
Total CHOL/HDL Ratio: 4.7 ratio
Triglycerides: 246 mg/dL — ABNORMAL HIGH (ref <150)
VLDL: 49 mg/dL — ABNORMAL HIGH (ref 0–40)

## 2023-08-09 LAB — BASIC METABOLIC PANEL WITH GFR
Anion gap: 12 (ref 5–15)
BUN: 15 mg/dL (ref 8–23)
CO2: 23 mmol/L (ref 22–32)
Calcium: 9.2 mg/dL (ref 8.9–10.3)
Chloride: 106 mmol/L (ref 98–111)
Creatinine, Ser: 0.88 mg/dL (ref 0.61–1.24)
GFR, Estimated: >60 mL/min (ref >60)
Glucose, Bld: 144 mg/dL — ABNORMAL HIGH (ref 70–99)
Potassium: 3.4 mmol/L — ABNORMAL LOW (ref 3.5–5.1)
Sodium: 141 mmol/L (ref 135–145)

## 2023-08-09 LAB — PROTEIN S ACTIVITY: Protein S Activity: 258 % — ABNORMAL HIGH (ref 63–140)

## 2023-08-09 LAB — ECHOCARDIOGRAM COMPLETE
AR max vel: 2.59 cm2
AV Area VTI: 2.76 cm2
AV Area mean vel: 2.58 cm2
AV Mean grad: 3 mmHg
AV Peak grad: 5.4 mmHg
Ao pk vel: 1.16 m/s
Area-P 1/2: 3.39 cm2
Height: 68.6 in
S' Lateral: 2.6 cm
Weight: 3328 [oz_av]

## 2023-08-09 LAB — BETA-2-GLYCOPROTEIN I ABS, IGG/M/A
Beta-2 Glyco I IgG: 12 GPI IgG units (ref 0–20)
Beta-2-Glycoprotein I IgA: <9 GPI IgA units (ref 0–25)
Beta-2-Glycoprotein I IgM: <9 GPI IgM units (ref 0–32)

## 2023-08-09 LAB — LUPUS ANTICOAGULANT PANEL
DRVVT: 44.2 s (ref 0.0–47.0)
PTT Lupus Anticoagulant: 40.4 s (ref 0.0–43.5)

## 2023-08-09 LAB — RAPID URINE DRUG SCREEN, HOSP PERFORMED
Amphetamines: NOT DETECTED
Barbiturates: NOT DETECTED
Benzodiazepines: POSITIVE — AB
Cocaine: NOT DETECTED
Opiates: NOT DETECTED
Tetrahydrocannabinol: NOT DETECTED

## 2023-08-09 LAB — PROTEIN C ACTIVITY: Protein C Activity: 168 % (ref 73–180)

## 2023-08-09 LAB — PROTEIN S, TOTAL: Protein S Ag, Total: 163 % — ABNORMAL HIGH (ref 60–150)

## 2023-08-09 LAB — HOMOCYSTEINE: Homocysteine: 12.3 umol/L (ref 0.0–17.2)

## 2023-08-09 MED ORDER — STROKE: EARLY STAGES OF RECOVERY BOOK
Freq: Once | Status: AC
  Filled 2023-08-09: qty 1

## 2023-08-09 MED ORDER — POTASSIUM CHLORIDE CRYS ER 20 MEQ PO TBCR
40.0000 meq | EXTENDED_RELEASE_TABLET | Freq: Once | ORAL | Status: AC
  Administered 2023-08-09: 40 meq via ORAL
  Filled 2023-08-09: qty 2

## 2023-08-09 MED ORDER — CLOPIDOGREL BISULFATE 75 MG PO TABS
75.0000 mg | ORAL_TABLET | Freq: Every day | ORAL | 0 refills | Status: DC
  Filled 2023-08-09: qty 30, 30d supply, fill #0

## 2023-08-09 MED ORDER — ROSUVASTATIN CALCIUM 40 MG PO TABS
40.0000 mg | ORAL_TABLET | Freq: Every day | ORAL | 0 refills | Status: DC
  Filled 2023-08-09: qty 30, 30d supply, fill #0

## 2023-08-09 MED ORDER — EZETIMIBE 10 MG PO TABS
10.0000 mg | ORAL_TABLET | Freq: Every day | ORAL | Status: DC
Start: 2023-08-09 — End: 2023-08-09

## 2023-08-09 MED ORDER — AMLODIPINE BESYLATE 5 MG PO TABS
10.0000 mg | ORAL_TABLET | Freq: Every day | ORAL | Status: DC
  Administered 2023-08-09: 10 mg via ORAL
  Filled 2023-08-09: qty 2

## 2023-08-09 MED ORDER — EZETIMIBE 10 MG PO TABS
10.0000 mg | ORAL_TABLET | Freq: Every day | ORAL | 1 refills | Status: DC
  Filled 2023-08-09: qty 90, 90d supply, fill #0

## 2023-08-09 MED ORDER — ASPIRIN 81 MG PO TBEC
81.0000 mg | DELAYED_RELEASE_TABLET | Freq: Every day | ORAL | Status: DC
  Administered 2023-08-09: 81 mg via ORAL
  Filled 2023-08-09: qty 1

## 2023-08-09 NOTE — Progress Notes (Signed)
 Lower extremity venous duplex completed. Please see CV Procedures for preliminary results.  Estanislao Heimlich, RVT 08/09/23 10:28 AM

## 2023-08-09 NOTE — Discharge Summary (Addendum)
 Name: Gordon Roy MRN: [id number] DOB: 08/27/1960 63 y.o. PCP: Hurshel Party, NP  Date of Admission: 08/08/2023 10:06 AM Date of Discharge:  08/09/23 Attending Physician: Dr.  Sol Blazing  DISCHARGE DIAGNOSIS:  Primary Problem: Transient ischemic attack   Hospital Problems: Principal Problem:   Transient ischemic attack Active Problems:   Hypertension   Acute ischemic multifocal posterior circulation stroke (HCC)   DISCHARGE MEDICATIONS:   Allergies as of 08/09/2023       Reactions   Codeine Itching   Hydrocodone Other (See Comments)   Numbness in face   Niaspan [niacin] Other (See Comments)   Unknown reaction   Vytorin [ezetimibe-simvastatin] Other (See Comments)   Unknown reaction   Morphine And Codeine Itching        Medication List     PAUSE taking these medications    acebutolol 400 MG capsule Wait to take this until your doctor or other care provider tells you to start again. Commonly known as: SECTRAL Take 1 capsule (400 mg total) by mouth 2 (two) times daily.   hydrochlorothiazide 12.5 MG tablet Wait to take this until your doctor or other care provider tells you to start again. Commonly known as: HYDRODIURIL Take 12.5 mg by mouth daily.   losartan 100 MG tablet Wait to take this until: August 10, 2023 Commonly known as: COZAAR Take 1 tablet (100 mg total) by mouth daily.       TAKE these medications    albuterol 108 (90 Base) MCG/ACT inhaler Commonly known as: VENTOLIN HFA Inhale 2 puffs into the lungs every 6 (six) hours as needed for wheezing or shortness of breath.   amLODipine 10 MG tablet Commonly known as: NORVASC Take 10 mg by mouth daily.   aspirin EC 81 MG tablet Take 81 mg by mouth daily.   celecoxib 200 MG capsule Commonly known as: CELEBREX Take 1 capsule (200 mg total) by mouth 2 (two) times daily.   clopidogrel 75 MG tablet Commonly known as: PLAVIX Take 1 tablet (75 mg total) by mouth daily. Start taking on: August 10, 2023    Jardiance 25 MG Tabs tablet Generic drug: empagliflozin Take 25 mg by mouth daily.   levothyroxine 125 MCG tablet Commonly known as: SYNTHROID Take 125 mcg by mouth daily.   Livalo 4 MG Tabs Generic drug: Pitavastatin Calcium Take 4 mg by mouth daily.   omeprazole 40 MG capsule Commonly known as: PRILOSEC Take 40 mg by mouth daily.   PARoxetine 40 MG tablet Commonly known as: PAXIL Take 40 mg by mouth daily.   rosuvastatin 40 MG tablet Commonly known as: CRESTOR Take 1 tablet (40 mg total) by mouth at bedtime.        DISPOSITION AND FOLLOW-UP:  Gordon Roy was discharged from [facility] in Stable condition. At the hospital follow up visit please address:  Follow-up Recommendations: Consults: Neurology Labs: BMP, hypercoagulable labs Studies: 30 day heart monitor  Medications: Aspirin, Plavix, rosuvastatin  HOSPITAL COURSE:  Patient Summary: #Stroke  #DLE  Presented with dizziness and double vision.  Code stroke called upon arrival in ED.  Initially thought to have been TIA.CTA neck notable for 60% stenosis of the proximal left internal carotid artery. CTA head notable for severe stenosis within the proximal left anterior inferior cerebellar artery, mid left superior cerebellar artery, paraclinoid right internal carotid artery, and the distal cavernous/paracclinoid left internal carotid artery.  MRI notable for subcentimeter acute infarcts in the cerebellum, right occipital lobe, right  thalamocapsular junction, and posterior temporal lobes.  He was started on aspirin and Plavix.  Echo unremarkable.  At discharge, back to his baseline.  History notable for lupus, so will follow-up with hypercoagulable labs at hospital follow-up. Will continue on DAPT for 3 months followed by Plavix alone. Will follow-up with neurology in 6-8 weeks.   #HLD #CAD Started on rosuvastatin 40 mg daily.  LDL elevated at 83, which was slightly above goal of less than 70  for secondary prevention. Continue statin at discharge.   #T2DM  Jardiance 25 mg daily.  A1c was 6.8. Follow-up outpatient.   #Hypothyroidism  Home Synthroid 125 mcg daily  #Hypertension  Initially held home amlodipine 10 mg daily, losartan 100 mg daily, hydrochlorothiazide 12.5 mg daily, and acebutolol 400 mg in the setting of permissive hypertension. Resumed amlodipine prior to discharge. Patient counseled to resume losartan the day after discharge. Furthermore, he was counseled on reaching out to his PCP for an earlier appointment if his BP is above 140/90 despite the two medications. At hospital follow-up resume previous medications as appropriate.   #Depression Home treatment includes paroxetine 40 mg daily. Follow-up outpatient.    DISCHARGE INSTRUCTIONS:   Discharge Instructions     Ambulatory referral to Neurology   Complete by: As directed    An appointment is requested in approximately: 8 weeks   Call MD for:  difficulty breathing, headache or visual disturbances   Complete by: As directed    Call MD for:  extreme fatigue   Complete by: As directed    Call MD for:  hives   Complete by: As directed    Call MD for:  persistant dizziness or light-headedness   Complete by: As directed    Call MD for:  persistant nausea and vomiting   Complete by: As directed    Call MD for:  redness, tenderness, or signs of infection (pain, swelling, redness, odor or green/yellow discharge around incision site)   Complete by: As directed    Call MD for:  severe uncontrolled pain   Complete by: As directed    Call MD for:  temperature >100.4   Complete by: As directed    Diet - low sodium heart healthy   Complete by: As directed    Discharge instructions   Complete by: As directed    Gordon Roy,  You were hospitalized for a stroke.  You were seen by neurology, who started you on different medications to help prevent this from occurring again in the future.  At this time, you are safe  to go home.  Please *START* taking the following medications: -Plavix 75 mg/day -Crestor 40 mg/day  Please resume taking losartan 100 mg/day tomorrow.  We have also restarted you on your amlodipine today.  If your blood pressure is greater than 140/90 after starting the losartan and amlodipine, please reach out to your primary care provider.  Please stop taking the acebutolol and hydrochlorothiazide until you follow-up with your primary care provider.  Depending on your blood pressure, they may restart you on these medications.  Please continue taking your other medications as prescribed.  Prior to discharge, you will also be given a heart monitor which she will follow-up with your primary care provider.  You will follow-up with neurology in 6 to 8 weeks. A referral for this has been placed.  Please also make sure to follow up with your primary care provider for hospital follow-up in the next week.  We are glad that you  are feeling better!   Increase activity slowly   Complete by: As directed        SUBJECTIVE:  Patient was evaluated bedside.  Denied any chest pain, double vision, blurry vision, dizziness, or other signs or symptoms.  He feels that he is back to his baseline.  Discussed importance of smoking cessation. Discharge Vitals:   BP (!) 168/95 (BP Location: Right Arm)   Pulse 71   Temp 98.3 F (36.8 C) (Oral)   Resp 20   Ht 5' 8.6" (1.742 m)   Wt 94.3 kg   SpO2 94%   BMI 31.08 kg/m   OBJECTIVE:  Physical Exam Constitutional:      Appearance: Normal appearance.  HENT:     Head: Normocephalic and atraumatic.  Cardiovascular:     Rate and Rhythm: Normal rate and regular rhythm.  Pulmonary:     Effort: Pulmonary effort is normal.     Breath sounds: Normal breath sounds.  Abdominal:     General: Abdomen is flat. Bowel sounds are normal.     Palpations: Abdomen is soft.  Musculoskeletal:     Cervical back: Normal range of motion and neck supple.  Neurological:      General: No focal deficit present.     Mental Status: He is alert and oriented to person, place, and time.  Psychiatric:        Mood and Affect: Mood normal.        Behavior: Behavior normal.   Pertinent Labs, Studies, and Procedures:     Latest Ref Rng & Units 08/09/2023    6:10 AM 08/08/2023   10:12 AM 08/08/2023   10:10 AM  CBC  WBC 4.0 - 10.5 K/uL 8.8   12.2   Hemoglobin 13.0 - 17.0 g/dL 16.1  09.6  04.5   Hematocrit 39.0 - 52.0 % 46.5  51.0  50.6   Platelets 150 - 400 K/uL 233   264        Latest Ref Rng & Units 08/09/2023    6:10 AM 08/08/2023   10:12 AM 08/08/2023   10:10 AM  CMP  Glucose 70 - 99 mg/dL 409  811  914   BUN 8 - 23 mg/dL 15  13  12    Creatinine 0.61 - 1.24 mg/dL 7.82  9.56  2.13   Sodium 135 - 145 mmol/L 141  139  137   Potassium 3.5 - 5.1 mmol/L 3.4  3.7  3.6   Chloride 98 - 111 mmol/L 106  103  103   CO2 22 - 32 mmol/L 23   23   Calcium 8.9 - 10.3 mg/dL 9.2   9.3   Total Protein 6.5 - 8.1 g/dL   7.9   Total Bilirubin 0.0 - 1.2 mg/dL   0.8   Alkaline Phos 38 - 126 U/L   80   AST 15 - 41 U/L   20   ALT 0 - 44 U/L   21     VAS [place] LOWER EXTREMITY VENOUS (DVT) Result Date: 08/09/2023  Lower Venous DVT Study Patient Name:  JAYQUAN BRADSH[facility]  Date of Exam:   08/09/2023 Medical Rec #: [id number]       Accession #:    [id number] Date of Birth: [birth year]        Patient Gender: M Patient Age:   18 years Exam Location:  [facility] Procedure:      VAS [place] LOWER EXTREMITY VENOUS (DVT) Referring Phys: Scheryl Marten XU --------------------------------------------------------------------------------  Indications: Stroke.  Risk Factors: None identified. Comparison Study: None. Performing Technologist: Shona Simpson  Examination Guidelines: A complete evaluation includes B-mode imaging, spectral Doppler, color Doppler, and power Doppler as needed of all accessible portions of each vessel. Bilateral testing is considered an integral part of a complete examination. Limited  examinations for reoccurring indications may be performed as noted. The reflux portion of the exam is performed with the patient in reverse Trendelenburg.  +---------+---------------+---------+-----------+----------+--------------+ RIGHT    CompressibilityPhasicitySpontaneityPropertiesThrombus Aging +---------+---------------+---------+-----------+----------+--------------+ CFV      Full           Yes      Yes                                 +---------+---------------+---------+-----------+----------+--------------+ SFJ      Full                                                        +---------+---------------+---------+-----------+----------+--------------+ FV Prox  Full                                                        +---------+---------------+---------+-----------+----------+--------------+ FV Mid   Full                                                        +---------+---------------+---------+-----------+----------+--------------+ FV DistalFull                                                        +---------+---------------+---------+-----------+----------+--------------+ PFV      Full                                                        +---------+---------------+---------+-----------+----------+--------------+ POP      Full           Yes      Yes                                 +---------+---------------+---------+-----------+----------+--------------+ PTV      Full                    Yes                                 +---------+---------------+---------+-----------+----------+--------------+ PERO     Full                    Yes                                 +---------+---------------+---------+-----------+----------+--------------+   +---------+---------------+---------+-----------+----------+--------------+  LEFT     CompressibilityPhasicitySpontaneityPropertiesThrombus Aging  +---------+---------------+---------+-----------+----------+--------------+ CFV      Full           Yes      Yes                                 +---------+---------------+---------+-----------+----------+--------------+ SFJ      Full                                                        +---------+---------------+---------+-----------+----------+--------------+ FV Prox  Full                                                        +---------+---------------+---------+-----------+----------+--------------+ FV Mid   Full                                                        +---------+---------------+---------+-----------+----------+--------------+ FV DistalFull                                                        +---------+---------------+---------+-----------+----------+--------------+ PFV      Full                                                        +---------+---------------+---------+-----------+----------+--------------+ POP      Full           Yes      Yes                                 +---------+---------------+---------+-----------+----------+--------------+ PTV      Full                    Yes                                 +---------+---------------+---------+-----------+----------+--------------+ PERO     Full                    Yes                                 +---------+---------------+---------+-----------+----------+--------------+    Summary: BILATERAL: - No evidence of deep vein thrombosis seen in the lower extremities, bilaterally. -No evidence of popliteal cyst, bilaterally.   *See table(s) above for measurements and observations.    Preliminary    EEG adult Result Date: 08/08/2023 Charlsie Quest, MD  08/08/2023  4:16 PM Patient Name: EMMANUEL GRUENHAGEN MRN: [id number] Epilepsy Attending: Charlsie Quest Referring Physician/Provider: Margaretmary Dys, MD Date: 08/08/2023 Duration: 22.17 mins Patient  history: 64 yo M with acute headache, horizontal diplopia, and dizziness. EEG to evaluate for seizure Level of alertness: Awake AEDs during EEG study: None Technical aspects: This EEG study was done with scalp electrodes positioned according to the 10-20 International system of electrode placement. Electrical activity was reviewed with band pass filter of 1-70Hz , sensitivity of 7 uV/mm, display speed of 49mm/sec with a 60Hz  notched filter applied as appropriate. EEG data were recorded continuously and digitally stored.  Video monitoring was available and reviewed as appropriate. Description: The posterior dominant rhythm consists of 8-9 Hz activity of moderate voltage (25-35 uV) seen predominantly in posterior head regions, symmetric and reactive to eye opening and eye closing. Hyperventilation and photic stimulation were not performed.   IMPRESSION: This study is within normal limits. No seizures or epileptiform discharges were seen throughout the recording. A normal interictal EEG does not exclude the diagnosis of epilepsy. Charlsie Quest   MRI Brain w/wo Contrast Result Date: 08/08/2023 CLINICAL DATA:  Transient ischemic attack (TIA). Acute headache, horizontal diplopia, and dizziness. EXAM: MRI HEAD WITHOUT AND WITH CONTRAST TECHNIQUE: Multiplanar, multiecho pulse sequences of the brain and surrounding structures were obtained without and with intravenous contrast. CONTRAST:  10mL GADAVIST GADOBUTROL 1 MMOL/ML IV SOLN COMPARISON:  Head CT and CTA 08/08/2023.  Head MRI 01/02/2023. FINDINGS: Brain: Subcentimeter acute infarcts are present in the right and possibly left cerebellar hemispheres, right occipital lobe, posterior right hippocampus, posteromedial left temporal lobe, and right thalamocapsular junction. A chronic microhemorrhage in the right cerebellar hemisphere is unchanged from the prior MRI. A moderate-sized region of encephalomalacia anteriorly in the left frontal lobe is again noted. Patchy T2  hyperintensities elsewhere in the cerebral white matter bilaterally are similar to the prior MRI and are nonspecific but compatible with moderate chronic small vessel ischemic disease. There are chronic lacunar infarcts in the right cerebellar hemisphere and pons. There is mild cerebral atrophy. No mass, midline shift, or extra-axial fluid collection is identified. No abnormal enhancement is identified. Vascular: Major intracranial vascular flow voids are preserved. Skull and upper cervical spine: Unremarkable bone marrow signal. Sinuses/Orbits: Unremarkable orbits. Mucosal thickening in the paranasal sinuses, moderate in the left ethmoid sinus and mild elsewhere. Clear mastoid air cells. Other: None. IMPRESSION: 1. Subcentimeter acute infarcts in the cerebellum, right occipital lobe, right thalamocapsular junction, and posterior temporal lobes. 2. Moderate chronic small vessel ischemic disease. 3. Left frontal encephalomalacia, likely the sequelae of remote trauma. Electronically Signed   By: Sebastian Ache M.D.   On: 08/08/2023 15:53   CT ANGIO HEAD NECK W WO CM (CODE STROKE) Result Date: 08/08/2023 CLINICAL DATA:  Provided history: Neuro deficit, acute, stroke suspected. Diplopia. Dizziness. Rule out posterior circulation LV 0. EXAM: CT ANGIOGRAPHY HEAD AND NECK WITH AND WITHOUT CONTRAST TECHNIQUE: Multidetector CT imaging of the head and neck was performed using the standard protocol during bolus administration of intravenous contrast. Multiplanar CT image reconstructions and MIPs were obtained to evaluate the vascular anatomy. Carotid stenosis measurements (when applicable) are obtained utilizing NASCET criteria, using the distal internal carotid diameter as the denominator. RADIATION DOSE REDUCTION: This exam was performed according to the departmental dose-optimization program which includes automated exposure control, adjustment of the mA and/or kV according to patient size and/or use of iterative  reconstruction technique. CONTRAST:  75mL OMNIPAQUE IOHEXOL 350 MG/ML  SOLN COMPARISON:  Non-contrast head CT performed earlier today 08/08/2023. FINDINGS: CTA NECK FINDINGS Aortic arch: Standard aortic branching. Atherosclerotic plaque within the visualized thoracic aorta and proximal major branch vessels of the neck. No hemodynamically significant innominate or proximal subclavian artery stenosis. Right carotid system: CCA and ICA patent within the neck. Atherosclerotic plaque within the proximal ICA resulting in less than 50% stenosis. Tortuosity of the distal cervical ICA. Left carotid system: CCA and ICA patent within the neck. Atherosclerotic plaque about the carotid bifurcation and within the proximal ICA resulting in up to 60% stenosis of the proximal ICA. Tortuosity of the distal cervical ICA. Vertebral arteries: The left vertebral artery is non dominant and developmentally diminutive, but patent throughout the neck. The dominant right vertebral artery is patent within the neck. Calcified plaque within the proximal right V1 segment with no more than mild stenosis. Skeleton: Cervical spondylosis. No acute fracture or aggressive osseous lesion. Other neck: Subcentimeter nodule within the right thyroid lobe not meeting consensus criteria for ultrasound follow-up based on size. No follow-up imaging recommended. Reference: J Am Coll Radiol. 2015 Feb;12(2): 143-50. Nonspecific asymmetric soft tissue prominence in the region of the right palatine tonsil (for instance as seen on series 5, image 78). Upper chest: No consolidation within the imaged lung apices. Review of the MIP images confirms the above findings CTA HEAD FINDINGS Anterior circulation: The intracranial internal carotid arteries are patent. Atherosclerotic plaque within both vessels. Most notably, there are severe stenoses within the paraclinoid right ICA and within the distal cavernous/paraclinoid left ICA. The M1 middle cerebral arteries are patent.  No M2 proximal branch occlusion or high-grade proximal stenosis. The anterior cerebral arteries are patent. Hypoplastic left A1 segment no intracranial aneurysm is identified. Posterior circulation: The intracranial vertebral arteries are patent. Calcified atherosclerotic plaque within the right V4 segment with moderate stenosis. The non-dominant left vertebral artery terminates predominantly as the left PICA. The basilar artery is patent. Severe stenosis within the proximal left anterior inferior cerebellar artery. Severe stenosis within the mid left superior cerebellar artery (series 12, image 21). The posterior cerebral arteries are patent. A right posterior communicating artery is present. The left posterior communicating artery is diminutive or absent. Venous sinuses: Within the limitations of contrast timing, no convincing thrombus. Anatomic variants: As described. Review of the MIP images confirms the above findings No emergent large vessel occlusion identified. These results were communicated to Dr. Iver Nestle at 10:59 amon 4/15/2025by text page via the [facility] messaging system. IMPRESSION: CTA neck: 1. The common carotid and internal carotid arteries are patent within neck. Atherosclerotic plaque bilaterally, as described. Most notably, there is up to 60% stenosis of the proximal left internal carotid artery. 2. The non-dominant left vertebral artery is developmentally diminutive, but patent throughout the neck. 3. The dominant right vertebral artery is patent within the neck. Calcified atherosclerotic plaque within the proximal right V1 segment with no more than mild stenosis. 4. Aortic Atherosclerosis (ICD10-I70.0). 5. Nonspecific asymmetric soft tissue prominence in the region of the right palatine tonsil. A mucosal neoplasm at this site cannot be excluded. ENT referral and direct visualization recommended. CTA head: 1. No proximal intracranial large vessel occlusion identified. 2. Intracranial atherosclerotic  disease with multifocal stenoses, most notably as follows. 3. Moderate stenosis within the right vertebral artery V4 segment. 4. Severe stenosis within the proximal left anterior inferior cerebellar artery. 5. Severe stenosis within the mid left superior cerebellar artery. 6. Severe stenosis within the paraclinoid right internal carotid artery. 7. Severe stenosis within  the distal cavernous/paraclinoid left internal carotid artery. Electronically Signed   By: Bascom Lily D.O.   On: 08/08/2023 11:00   CT HEAD CODE STROKE WO CONTRAST Result Date: 08/08/2023 CLINICAL DATA:  Code stroke. Provided history: Neuro deficit, acute, stroke suspected. Headache. Blurred vision. EXAM: CT HEAD WITHOUT CONTRAST TECHNIQUE: Contiguous axial images were obtained from the base of the skull through the vertex without intravenous contrast. RADIATION DOSE REDUCTION: This exam was performed according to the departmental dose-optimization program which includes automated exposure control, adjustment of the mA and/or kV according to patient size and/or use of iterative reconstruction technique. COMPARISON:  Brain MRI 02/13/2021. FINDINGS: Brain: Generalized cerebral atrophy. Focus of chronic encephalomalacia/gliosis again demonstrated within the anterior left frontal lobe. Mild patchy and ill-defined hypoattenuation elsewhere within the cerebral white matter, nonspecific but compatible with chronic small vessel ischemic disease. Chronic lacunar infarct again demonstrated within the central pons. There is no acute intracranial hemorrhage. No acute demarcated cortical infarct. No extra-axial fluid collection. No evidence of an intracranial mass. No midline shift. Vascular: No hyperdense vessel. Atherosclerotic calcifications. Skull: No calvarial fracture or aggressive osseous lesion. Prior left frontal cranioplasty. Sinuses/Orbits: No orbital mass or acute orbital finding. Prior left frontal cranioplasty with opacification of the frontal  sinuses. Moderate left ethmoid sinusitis. Mild mucosal thickening within the left sphenoid and left maxillary sinuses. ASPECTS ([place] Stroke Program Early CT Score) - Ganglionic level infarction (caudate, lentiform nuclei, internal capsule, insula, M1-M3 cortex): 7 - Supraganglionic infarction (M4-M6 cortex): 3 Total score (0-10 with 10 being normal): 10 (when discounting a focus of chronic encephalomalacia/gliosis within the anterior left frontal lobe). No evidence of an acute intracranial abnormality. These results were communicated to Dr. Cleone Dad At 10:33 amon 4/15/2025by text page via the [facility] messaging system. IMPRESSION: 1.  No evidence of an acute intracranial abnormality. 2. Focus of chronic encephalomalacia/gliosis within the anterior left frontal lobe. 3. 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 4. Background parenchymal atrophy and chronic small vessel ischemic disease. 5. Paranasal sinus disease as described. Electronically Signed   By: Bascom Lily D.O.   On: 08/08/2023 10:33    Signed: Maxie Spaniel, MD Internal Medicine Resident, PGY-1 [facility]  Pager: [phone]

## 2023-08-09 NOTE — Progress Notes (Addendum)
 STROKE TEAM PROGRESS NOTE    SIGNIFICANT HOSPITAL EVENTS  Code stroke activated due to diplopia, dizziness MRI shows acute infarct right occipital, posterior temporal, right thalamic capsular junction, cerebellum.  INTERIM HISTORY/SUBJECTIVE  Patient sitting up in bed, wife at bedside. Neurological exam has improved.  No vision deficits, no headache, no dizziness, no focal weakness. Pending echo for full stroke workup. Follow-up care was discussed with patient and wife at bedside, all questions answered.  OBJECTIVE  CBC    Component Value Date/Time   WBC 8.8 08/09/2023 0610   RBC 5.14 08/09/2023 0610   HGB 16.0 08/09/2023 0610   HCT 46.5 08/09/2023 0610   PLT 233 08/09/2023 0610   MCV 90.5 08/09/2023 0610   MCH 31.1 08/09/2023 0610   MCHC 34.4 08/09/2023 0610   RDW 12.7 08/09/2023 0610   LYMPHSABS 1.1 08/08/2023 1010   MONOABS 0.9 08/08/2023 1010   EOSABS 0.1 08/08/2023 1010   BASOSABS 0.1 08/08/2023 1010    BMET    Component Value Date/Time   NA 141 08/09/2023 0610   K 3.4 (L) 08/09/2023 0610   CL 106 08/09/2023 0610   CO2 23 08/09/2023 0610   GLUCOSE 144 (H) 08/09/2023 0610   BUN 15 08/09/2023 0610   CREATININE 0.88 08/09/2023 0610   CALCIUM 9.2 08/09/2023 0610   GFRNONAA >60 08/09/2023 0610    IMAGING past 24 hours EEG adult Result Date: 08/08/2023 Charlsie Quest, MD     08/08/2023  4:16 PM Patient Name: Gordon Roy MRN: [id number] Epilepsy Attending: Charlsie Quest Referring Physician/Provider: Margaretmary Dys, MD Date: 08/08/2023 Duration: 22.17 mins Patient history: 63 yo M with acute headache, horizontal diplopia, and dizziness. EEG to evaluate for seizure Level of alertness: Awake AEDs during EEG study: None Technical aspects: This EEG study was done with scalp electrodes positioned according to the 10-20 International system of electrode placement. Electrical activity was reviewed with band pass filter of 1-70Hz , sensitivity of 7 uV/mm,  display speed of 33mm/sec with a 60Hz  notched filter applied as appropriate. EEG data were recorded continuously and digitally stored.  Video monitoring was available and reviewed as appropriate. Description: The posterior dominant rhythm consists of 8-9 Hz activity of moderate voltage (25-35 uV) seen predominantly in posterior head regions, symmetric and reactive to eye opening and eye closing. Hyperventilation and photic stimulation were not performed.   IMPRESSION: This study is within normal limits. No seizures or epileptiform discharges were seen throughout the recording. A normal interictal EEG does not exclude the diagnosis of epilepsy. Charlsie Quest   MRI Brain w/wo Contrast Result Date: 08/08/2023 CLINICAL DATA:  Transient ischemic attack (TIA). Acute headache, horizontal diplopia, and dizziness. EXAM: MRI HEAD WITHOUT AND WITH CONTRAST TECHNIQUE: Multiplanar, multiecho pulse sequences of the brain and surrounding structures were obtained without and with intravenous contrast. CONTRAST:  10mL GADAVIST GADOBUTROL 1 MMOL/ML IV SOLN COMPARISON:  Head CT and CTA 08/08/2023.  Head MRI 01/02/2023. FINDINGS: Brain: Subcentimeter acute infarcts are present in the right and possibly left cerebellar hemispheres, right occipital lobe, posterior right hippocampus, posteromedial left temporal lobe, and right thalamocapsular junction. A chronic microhemorrhage in the right cerebellar hemisphere is unchanged from the prior MRI. A moderate-sized region of encephalomalacia anteriorly in the left frontal lobe is again noted. Patchy T2 hyperintensities elsewhere in the cerebral white matter bilaterally are similar to the prior MRI and are nonspecific but compatible with moderate chronic small vessel ischemic disease. There are chronic lacunar infarcts in the right cerebellar  hemisphere and pons. There is mild cerebral atrophy. No mass, midline shift, or extra-axial fluid collection is identified. No abnormal enhancement  is identified. Vascular: Major intracranial vascular flow voids are preserved. Skull and upper cervical spine: Unremarkable bone marrow signal. Sinuses/Orbits: Unremarkable orbits. Mucosal thickening in the paranasal sinuses, moderate in the left ethmoid sinus and mild elsewhere. Clear mastoid air cells. Other: None. IMPRESSION: 1. Subcentimeter acute infarcts in the cerebellum, right occipital lobe, right thalamocapsular junction, and posterior temporal lobes. 2. Moderate chronic small vessel ischemic disease. 3. Left frontal encephalomalacia, likely the sequelae of remote trauma. Electronically Signed   By: Aundra Lee M.D.   On: 08/08/2023 15:53   CT ANGIO HEAD NECK W WO CM (CODE STROKE) Result Date: 08/08/2023 CLINICAL DATA:  Provided history: Neuro deficit, acute, stroke suspected. Diplopia. Dizziness. Rule out posterior circulation LV 0. EXAM: CT ANGIOGRAPHY HEAD AND NECK WITH AND WITHOUT CONTRAST TECHNIQUE: Multidetector CT imaging of the head and neck was performed using the standard protocol during bolus administration of intravenous contrast. Multiplanar CT image reconstructions and MIPs were obtained to evaluate the vascular anatomy. Carotid stenosis measurements (when applicable) are obtained utilizing NASCET criteria, using the distal internal carotid diameter as the denominator. RADIATION DOSE REDUCTION: This exam was performed according to the departmental dose-optimization program which includes automated exposure control, adjustment of the mA and/or kV according to patient size and/or use of iterative reconstruction technique. CONTRAST:  75mL OMNIPAQUE IOHEXOL 350 MG/ML SOLN COMPARISON:  Non-contrast head CT performed earlier today 08/08/2023. FINDINGS: CTA NECK FINDINGS Aortic arch: Standard aortic branching. Atherosclerotic plaque within the visualized thoracic aorta and proximal major branch vessels of the neck. No hemodynamically significant innominate or proximal subclavian artery stenosis.  Right carotid system: CCA and ICA patent within the neck. Atherosclerotic plaque within the proximal ICA resulting in less than 50% stenosis. Tortuosity of the distal cervical ICA. Left carotid system: CCA and ICA patent within the neck. Atherosclerotic plaque about the carotid bifurcation and within the proximal ICA resulting in up to 60% stenosis of the proximal ICA. Tortuosity of the distal cervical ICA. Vertebral arteries: The left vertebral artery is non dominant and developmentally diminutive, but patent throughout the neck. The dominant right vertebral artery is patent within the neck. Calcified plaque within the proximal right V1 segment with no more than mild stenosis. Skeleton: Cervical spondylosis. No acute fracture or aggressive osseous lesion. Other neck: Subcentimeter nodule within the right thyroid lobe not meeting consensus criteria for ultrasound follow-up based on size. No follow-up imaging recommended. Reference: J Am Coll Radiol. 2015 Feb;12(2): 143-50. Nonspecific asymmetric soft tissue prominence in the region of the right palatine tonsil (for instance as seen on series 5, image 78). Upper chest: No consolidation within the imaged lung apices. Review of the MIP images confirms the above findings CTA HEAD FINDINGS Anterior circulation: The intracranial internal carotid arteries are patent. Atherosclerotic plaque within both vessels. Most notably, there are severe stenoses within the paraclinoid right ICA and within the distal cavernous/paraclinoid left ICA. The M1 middle cerebral arteries are patent. No M2 proximal branch occlusion or high-grade proximal stenosis. The anterior cerebral arteries are patent. Hypoplastic left A1 segment no intracranial aneurysm is identified. Posterior circulation: The intracranial vertebral arteries are patent. Calcified atherosclerotic plaque within the right V4 segment with moderate stenosis. The non-dominant left vertebral artery terminates predominantly as the  left PICA. The basilar artery is patent. Severe stenosis within the proximal left anterior inferior cerebellar artery. Severe stenosis within the mid left superior  cerebellar artery (series 12, image 21). The posterior cerebral arteries are patent. A right posterior communicating artery is present. The left posterior communicating artery is diminutive or absent. Venous sinuses: Within the limitations of contrast timing, no convincing thrombus. Anatomic variants: As described. Review of the MIP images confirms the above findings No emergent large vessel occlusion identified. These results were communicated to Dr. Cleone Dad at 10:59 amon 4/15/2025by text page via the [facility] messaging system. IMPRESSION: CTA neck: 1. The common carotid and internal carotid arteries are patent within neck. Atherosclerotic plaque bilaterally, as described. Most notably, there is up to 60% stenosis of the proximal left internal carotid artery. 2. The non-dominant left vertebral artery is developmentally diminutive, but patent throughout the neck. 3. The dominant right vertebral artery is patent within the neck. Calcified atherosclerotic plaque within the proximal right V1 segment with no more than mild stenosis. 4. Aortic Atherosclerosis (ICD10-I70.0). 5. Nonspecific asymmetric soft tissue prominence in the region of the right palatine tonsil. A mucosal neoplasm at this site cannot be excluded. ENT referral and direct visualization recommended. CTA head: 1. No proximal intracranial large vessel occlusion identified. 2. Intracranial atherosclerotic disease with multifocal stenoses, most notably as follows. 3. Moderate stenosis within the right vertebral artery V4 segment. 4. Severe stenosis within the proximal left anterior inferior cerebellar artery. 5. Severe stenosis within the mid left superior cerebellar artery. 6. Severe stenosis within the paraclinoid right internal carotid artery. 7. Severe stenosis within the distal  cavernous/paraclinoid left internal carotid artery. Electronically Signed   By: Bascom Lily D.O.   On: 08/08/2023 11:00   CT HEAD CODE STROKE WO CONTRAST Result Date: 08/08/2023 CLINICAL DATA:  Code stroke. Provided history: Neuro deficit, acute, stroke suspected. Headache. Blurred vision. EXAM: CT HEAD WITHOUT CONTRAST TECHNIQUE: Contiguous axial images were obtained from the base of the skull through the vertex without intravenous contrast. RADIATION DOSE REDUCTION: This exam was performed according to the departmental dose-optimization program which includes automated exposure control, adjustment of the mA and/or kV according to patient size and/or use of iterative reconstruction technique. COMPARISON:  Brain MRI 02/13/2021. FINDINGS: Brain: Generalized cerebral atrophy. Focus of chronic encephalomalacia/gliosis again demonstrated within the anterior left frontal lobe. Mild patchy and ill-defined hypoattenuation elsewhere within the cerebral white matter, nonspecific but compatible with chronic small vessel ischemic disease. Chronic lacunar infarct again demonstrated within the central pons. There is no acute intracranial hemorrhage. No acute demarcated cortical infarct. No extra-axial fluid collection. No evidence of an intracranial mass. No midline shift. Vascular: No hyperdense vessel. Atherosclerotic calcifications. Skull: No calvarial fracture or aggressive osseous lesion. Prior left frontal cranioplasty. Sinuses/Orbits: No orbital mass or acute orbital finding. Prior left frontal cranioplasty with opacification of the frontal sinuses. Moderate left ethmoid sinusitis. Mild mucosal thickening within the left sphenoid and left maxillary sinuses. ASPECTS ([place]berta Stroke Program Early CT Score) - Ganglionic level infarction (caudate, lentiform nuclei, internal capsule, insula, M1-M3 cortex): 7 - Supraganglionic infarction (M4-M6 cortex): 3 Total score (0-10 with 10 being normal): 10 (when discounting a focus  of chronic encephalomalacia/gliosis within the anterior left frontal lobe). No evidence of an acute intracranial abnormality. These results were communicated to Dr. Cleone Dad At 10:33 amon 4/15/2025by text page via the [facility] messaging system. IMPRESSION: 1.  No evidence of an acute intracranial abnormality. 2. Focus of chronic encephalomalacia/gliosis within the anterior left frontal lobe. 3. 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 4. Background parenchymal atrophy and chronic small vessel ischemic disease. 5. Paranasal sinus disease as described. Electronically  Signed   By: Jackey Loge D.O.   On: 08/08/2023 10:33    Vitals:   08/08/23 2000 08/09/23 0030 08/09/23 0424 08/09/23 0801  BP: (!) 178/94 (!) 169/82 (!) 174/86 (!) 157/101  Pulse: 66 66 82 82  Resp: 18 18 18 20   Temp: 98.2 F (36.8 C) 98.4 F (36.9 C) 98.1 F (36.7 C) 98 F (36.7 C)  TempSrc: Oral Axillary Oral Oral  SpO2: 97% 100% 91% 95%  Weight:      Height:         PHYSICAL EXAM General:  Alert, well-nourished, well-developed patient in no acute distress Psych:  Mood and affect appropriate for situation CV: Regular rate and rhythm on monitor Respiratory:  Regular, unlabored respirations on room air GI: Abdomen soft and nontender   NEURO:  Mental Status: AA&Ox3, patient is able to give clear and coherent history Speech/Language: speech is without dysarthria or aphasia.  Naming, repetition, fluency, and comprehension intact.  Cranial Nerves:  II: PERRL. Visual fields full.  III, IV, VI: EOMI. Eyelids elevate symmetrically.  V: Sensation is intact to light touch and symmetrical to face.  VII: Face is symmetrical resting and smiling VIII: hearing intact to voice. IX, X: Palate elevates symmetrically. Phonation is normal.  ZO:[id number] shrug 5/5. XII: tongue is midline without fasciculations. Motor: 5/5 strength to all muscle groups tested. No focal weakness.  Tone: is normal and bulk is normal Sensation-  Intact to light touch bilaterally. Extinction absent to light touch to DSS.   Coordination: FTN intact bilaterally, HKS: no ataxia in BLE.No drift.  Gait- deferred  Most Recent NIH: 0     ASSESSMENT/PLAN  Mr. Gordon Roy is a 63 y.o. male with history of TIA, HTN, HLD, concussion, CAD, sleep apnea, was recently quit smoking, obesity, GERD, anxiety admitted for stroke workup after presenting with diplopia and dizziness.  NIH on Admission: 0.  Stroke:  right occipital, thalamic, posterior temporal lobe, cerebellum  Etiology:  likely due to large vessel disease with multifocal posterior circulation stenosis.  Can not rule out cardioembolic source CT head No evidence of an acute intracranial abnormality. Focus of chronic encephalomalacia/gliosis within the anterior left frontal lobe. 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  CTA head & neck Up to 60% stenosis proximal left ICA. Dominant right VA with V4 high grade stenosis, severe stenosisproximal left anterior inferior cerebellar artery, mid left superior cerebellar artery, distal cavernous left ICA, severe stenosis paraclinoid right ICA. MRI  Subcentimeter acute infarct cerebellum, right occipital, right thalamic capsular junction, posterior temporal lobe. Left frontal encephalomalacia, likely due to previous concussion EEG: Within normal limits. No seizures or epileptiform discharges were seen throughout the recording.  2D Echo EF 60-65% US DVT: No evidence of DVT bilaterally Recommend 30-day cardiac event monitor as outpt LDL 83 HgbA1c 6.8 UDS + benzo Hypercoagulable work up pending VTE prophylaxis - SCDs aspirin 81 mg daily prior to admission, now on aspirin 81 mg daily and clopidogrel 75 mg daily for 3 months (due to severe stenosis seen at R V4) and then plavix alone. Therapy recommendations:  none Disposition:  discharge home later today  Hx of ? Posterior TIA vs. seizure Per patient, he had left arm shocking pain twice  back to back one day last year followed by confusion for several days. Questionable staring spells. Not sure if posterior stroke or seizure No notes concerning TIA seen in chart review Chronic lacunar infarct seen on MRI   Hx of concussion 2022 Injury at  work, hit in head by tree Saw outpatient neurology for follow-up due to concern for memory loss MRI 01/2021 showed left frontal lobe encephalomalacia and chronic microvascular disease Followed with Tex Filbert at [facility]  Hypertension Home meds: Amlodipine 10 mg daily, HydroDIURIL 12.5 mg daily, losartan 100 mg daily, acebutalol  400 mg twice daily Stable Long term BP goal normotensive Avoid hypotension  Hyperlipidemia Home meds:  pitavastatin 4mg  LDL 83, goal < 70 Not tolerating lipitor and crestor in the past Add zetia Continue home pitavastatin Continue statin at discharge  Diabetes type II, Pre-diabetic Home meds:  none HgbA1c 6.8, goal < 7.0 CBGs SSI Recommend close follow-up with PCP   Tobacco abuse Current smoker Smoking cessation counseling provided Pt is willing to quit  Other Stroke Risk Factors Obesity, Body mass index is 31.08 kg/m., BMI >/= 30 associated with increased stroke risk, recommend weight loss, diet and exercise as appropriate  Coronary artery disease Obstructive sleep apnea, not on CPAP at home  Other Active Problems History of depression, Paxil Home medication  Hospital day # 0   Pt seen by Neuro NP/APP and later by MD. Note/plan to be edited by MD as needed.    Audrene Lease, DNP, AGACNP-BC [facility] Please use AMION for contact information & EPIC for messaging.  ATTENDING NOTE: I reviewed above note and agree with the assessment and plan. Pt was seen and examined.   Wife and multiple family members and friends are at the bedside. Pt lying in bed, stated that his symptoms have resolved. However, yesterday he was driving and he had double vision more looking at right,  feeling dizzy with HA and resolved in 20-30 min. CTA did show multifocal posterior circulation stenosis including right dominant VA V4, severe left AICA, left SCA, b/l siphon. His stroke likely from multifocal stenosis in the setting on multiple uncontrolled risk factors. Will also do 30 day cardiac event monitoring to rule out afib as outpt. Will recommend DAPT for 3 months and then plavix alone. He was not tolerating lipitor and crestor before and since LDL 87 not too far from goal, will continue pitavastatin and add zetia. Will follow up with Abraham Hoffmann at [facility] in 4-6 weeks.   For detailed assessment and plan, please refer to above as I have made changes wherever appropriate.   Neurology will sign off. Please call with questions. Pt will follow up with Tex Filbert at [facility] in about 4-6 weeks. Thanks for the consult.   Consuelo Denmark, MD PhD Stroke Neurology 08/09/2023 4:36 PM     To contact Stroke Continuity provider, please refer to [email or web address]. After hours, contact General Neurology

## 2023-08-09 NOTE — TOC Transition Note (Signed)
 Transition of Care [facility]) - Discharge Note   Patient Details  Name: Gordon Roy MRN: [id number] Date of Birth: [birth year]  Transition of [facility]) CM/SW Contact:  Jonathan Neighbor, RN Phone Number: 08/09/2023, 10:46 AM   Clinical Narrative:     Pt reviewed and no current TOC needs. If needs arise please consult TOC.         Patient Goals and CMS Choice            Discharge Placement                       Discharge Plan and Services Additional resources added to the After Visit Summary for                                       Social Drivers of Health (SDOH) Interventions SDOH Screenings   Food Insecurity: No Food Insecurity (08/08/2023)  Housing: Low Risk  (08/08/2023)  Transportation Needs: No Transportation Needs (08/08/2023)  Utilities: Not At Risk (08/08/2023)  Social Connections: Socially Integrated (08/08/2023)  Tobacco Use: High Risk (08/08/2023)     Readmission Risk Interventions     No data to display

## 2023-08-09 NOTE — Plan of Care (Signed)

## 2023-08-10 ENCOUNTER — Encounter: Payer: Self-pay | Admitting: Neurology

## 2023-08-10 ENCOUNTER — Other Ambulatory Visit: Payer: Self-pay | Admitting: Cardiology

## 2023-08-10 ENCOUNTER — Telehealth: Payer: Self-pay | Admitting: Cardiology

## 2023-08-10 DIAGNOSIS — I63531 Cerebral infarction due to unspecified occlusion or stenosis of right posterior cerebral artery: Secondary | ICD-10-CM

## 2023-08-10 LAB — PROTEIN C, TOTAL: Protein C, Total: 128 % (ref 60–150)

## 2023-08-10 LAB — CARDIOLIPIN ANTIBODIES, IGG, IGM, IGA
Anticardiolipin IgA: <9 U/mL (ref 0–11)
Anticardiolipin IgG: <9 GPL U/mL (ref 0–14)
Anticardiolipin IgM: 22 [MPL'U]/mL — ABNORMAL HIGH (ref 0–12)

## 2023-08-10 NOTE — Telephone Encounter (Signed)
 Pt c/o medication issue:  1. Name of Medication: Acebutolol  2. How are you currently taking this medication (dosage and times per day)?   3. Are you having a reaction (difficulty breathing--STAT)?   4. What is your medication issue? Patient been in the hospital and said he was told to stop taking this medicine. He is afraid if he does not take it, his PVC's will start.

## 2023-08-10 NOTE — Telephone Encounter (Signed)
 Called and spoke to patient. Patient had questions regarding Acebutolol and rather or not he should continue it. Informed patient that I could make him an office visit to discuss medications. Patient agreeable. Appt made for 08/18/2023 with Dr. Ronell Coe. He verbalized understanding and had no further questions.

## 2023-08-10 NOTE — Progress Notes (Signed)
 Ordered 30-day event monitor following CVA at request of neurology.  Monitor to be read by Dr. Ronell Coe. Patient currently has a schedule with Dr. Ronell Coe scheduled for next week to discuss medications.

## 2023-08-13 LAB — CULTURE, BLOOD (ROUTINE X 2)
Culture: NO GROWTH
Culture: NO GROWTH
Special Requests: ADEQUATE
Special Requests: ADEQUATE

## 2023-08-16 DIAGNOSIS — I493 Ventricular premature depolarization: Secondary | ICD-10-CM | POA: Insufficient documentation

## 2023-08-16 DIAGNOSIS — F418 Other specified anxiety disorders: Secondary | ICD-10-CM | POA: Insufficient documentation

## 2023-08-18 ENCOUNTER — Ambulatory Visit

## 2023-08-18 VITALS — BP 162/94 | HR 80 | Ht 68.6 in | Wt 210.0 lb

## 2023-08-18 DIAGNOSIS — I63549 Cerebral infarction due to unspecified occlusion or stenosis of unspecified cerebellar artery: Secondary | ICD-10-CM | POA: Diagnosis not present

## 2023-08-18 DIAGNOSIS — I2510 Atherosclerotic heart disease of native coronary artery without angina pectoris: Secondary | ICD-10-CM | POA: Diagnosis not present

## 2023-08-18 DIAGNOSIS — I10 Essential (primary) hypertension: Secondary | ICD-10-CM | POA: Diagnosis not present

## 2023-08-18 DIAGNOSIS — Z87891 Personal history of nicotine dependence: Secondary | ICD-10-CM

## 2023-08-18 DIAGNOSIS — I493 Ventricular premature depolarization: Secondary | ICD-10-CM

## 2023-08-18 DIAGNOSIS — E7800 Pure hypercholesterolemia, unspecified: Secondary | ICD-10-CM

## 2023-08-18 MED ORDER — CARVEDILOL 12.5 MG PO TABS
12.5000 mg | ORAL_TABLET | Freq: Two times a day (BID) | ORAL | 3 refills | Status: DC
Start: 2023-08-18 — End: 2023-09-26

## 2023-08-18 NOTE — Assessment & Plan Note (Signed)
 Frequent PVC burden in the past. Well-controlled on acebutolol  symptomatically. Now that acebutolol  has been on hold since recent stroke for permissive hypertension, see discussion about with regards to blood pressure optimization with carvedilol.  Currently has a 30-day heart monitor to assess for any cardiac arrhythmias. Will review PVC burden once report available.

## 2023-08-18 NOTE — Assessment & Plan Note (Addendum)
 Minimal nonobstructive CAD with mid LAD 35% stenosis on cardiac cath normal 2017. Stress test with nuclear imaging June 2021 showed no ischemia. Remains asymptomatic, good functional status. Continue with antiplatelet therapy, currently on dual antiplatelet therapy given recent CVA episode. Continue with pitavastatin  4 mg once daily.

## 2023-08-18 NOTE — Assessment & Plan Note (Signed)
 Quit smoking August 08, 2023. Encouraged to continue staying abstinent. Congratulated him on quitting.

## 2023-08-18 NOTE — Progress Notes (Signed)
 Cardiology Consultation:    Date:  08/18/2023   ID:  Gordon Roy, DOB [birth year], MRN [id number]  PCP:  Gordon Berthold, NP  Cardiologist:  Gordon Kells, MD   Referring MD: Gordon Berthold, NP   No chief complaint on file.    ASSESSMENT AND PLAN:   Gordon Roy 63 year old male with history of nonobstructive coronary artery disease on prior From normal 2017 and last stress test no ischemia on June 2021, frequent PVC 8.6% burden on Zio patch May 2021, normal biventricular function with LVEF 60 to 65%, diabetes mellitus, hypertension, hyperlipidemia, hypothyroidism, depression, now with CVA multifocal April 2025 with vertebral artery and cerebral artery stenosis on CTA head and neck, smoking [now quit after the stroke August 08, 2023]  Here for follow-up to discuss further management and outpatient event monitor to assess for cardiac arrhythmias and optimization for blood pressure medications that were suspended for permissive hypertension at recent inpatient stay and have not been resumed yet.  Problem List Items Addressed This Visit     Mild CAD - Primary   Minimal nonobstructive CAD with mid LAD 35% stenosis on cardiac cath normal 2017. Stress test with nuclear imaging June 2021 showed no ischemia. Remains asymptomatic, good functional status. Continue with antiplatelet therapy, currently on dual antiplatelet therapy given recent CVA episode. Continue with pitavastatin  4 mg once daily.       Relevant Medications   carvedilol (COREG) 12.5 MG tablet   Essential hypertension   Suboptimal control. Target blood pressure below 130/80 mmHg. Continue amlodipine  10 mg once daily Continue losartan  100 mg once daily. Was previously on acebutolol . Will switch this to carvedilol 12.5 mg twice daily for better blood pressure optimization. In the past with hydrochlorothiazide  he did feel side effects of increased diuresis and self discontinued and prefers to hold off on this. Advised  him to monitor blood pressures at home once Roy day for the next couple weeks after starting carvedilol to see blood pressure response.  If suboptimal we will titrate up carvedilol.        Relevant Medications   carvedilol (COREG) 12.5 MG tablet   Hypercholesteremia   Lipid panel from 08/09/2023 total cholesterol 168, triglycerides 246, HDL 36, LDL 83. Currently on Pitavastatin 4mg , Roy moderate intensity statin regimen.  Will reassess lipid panel at 64-month follow-up visit and if LDL not at goal will titrate up to high intensity statin therapy with atorvastatin or rosuvastatin .        Relevant Medications   carvedilol (COREG) 12.5 MG tablet   Former smoker   Quit smoking August 08, 2023. Encouraged to continue staying abstinent. Congratulated him on quitting.      CVA (cerebral vascular accident) [facility])   Recent acute stroke August 08, 2023. No residual neurological deficits. Pending follow-up with neurologist as outpatient. Incidence appears secondary to cerebrovascular disease.  Has 30-day event monitor set up at discharge from the hospital.  Will review results once available. Currently remains on dual antiplatelet therapy recommended for 3 months with aspirin  and Plavix  and subsequently has recommendations to continue Plavix .  Normal biventricular function on echocardiogram. However no agitated saline study performed.  With possible vascular disease as noted on CT coronary angiogram, low suspicion for cardiac sources of embolization to be any underlying etiology here. Will defer to neurologist to see if they would recommend long-term monitoring for Roy-fib.  For the 30 days study is uneventful. Will also similarly defer to neurologist if they would recommend further  evaluation for cardiac sources of embolization secondary to PFO.      Relevant Medications   carvedilol (COREG) 12.5 MG tablet   PVC (premature ventricular contraction), 8.6% burden on Zio patch May 2021   Frequent  PVC burden in the past. Well-controlled on acebutolol  symptomatically. Now that acebutolol  has been on hold since recent stroke for permissive hypertension, see discussion about with regards to blood pressure optimization with carvedilol.  Currently has Roy 30-day heart monitor to assess for any cardiac arrhythmias. Will review PVC burden once report available.       Relevant Medications   carvedilol (COREG) 12.5 MG tablet   Return to the tentatively in 3 months   History of Present Illness:    Gordon Roy is Roy 63 y.o. male who is being seen today for the evaluation of cardiac evaluation in the setting of recent stroke at the request of [name], Gordon A, NP. Previously followed up with Gordon Roy at our office last visit 02/11/2021.  Pleasant gentleman.  Here for the visit today accompanied by his wife.  Works at [employer].  History of CVA multifocal April 2025, minimal nonobstructive CAD [mid LAD 35% stenosis on cardiac cath November 2017; stress test with nuclear imaging June 2021 no ischemia], frequent PVCs [8.6% burden on Zio patch 3 days study May 2021], normal biventricular function on echocardiogram April 2025 diabetes mellitus, hypertension, hyperlipidemia, hypothyroidism, depression, vertebral artery and cerebellar artery stenosis on CTA head and neck from April 2025, smoking [now quit after the stroke August 08, 2023]   Recently was admitted at [facility] on August 08, 2023 and discharged the following day for reported CVA posterior circulation with MRI confirming acute infarcts in cerebellum, right occipital lobe, right thalamocapsular junction and posterior temporal lobes.  Was discharged on dual antiplatelet therapy to be continued for 3 months followed by monotherapy with Plavix .  Was discharged on amlodipine  and recommendations to start losartan  following day in order to allow for permissive hypertension [Home medication acebutolol  and  hydrochlorothiazide  were discontinued].  Was discharged home on 30-day event monitor.  His overall his symptoms were lasting for about 24 hours.  These have now subsided and he is back to his baseline.  Has returned to his work.  Denies any symptoms of chest pain or shortness of breath. No lightheadedness, dizziness or syncopal episodes. No pedal edema. No blood in urine or stools. Has noted blood pressures at home to remain uncontrolled.  Currently pending follow-up visit with neurologist.  Echocardiogram completed during inpatient stay August 09, 2023 noted biventricular function normal with LVEF 60 to 65%, normal diastolic function, mild aortic valve calcification without stenosis.  No other significant valve dysfunction.  No atrial level shunt reported by color Doppler.  Agitated saline study/bubble study  EKG from August 08, 2023 at [facility] noted sinus rhythm heart rate 73/min, normal PR interval 173 ms, QTc normal 449 ms, QRS duration now 98 ms.  Lipid panel from 08/09/2023 total cholesterol 168, triglycerides 246, HDL 36, LDL 83. Hemoglobin A1c 6.8 on 08/08/2023  Quit smoking August 08, 2023. Does not drink alcohol. No substance abuse.   Past Medical History:  Diagnosis Date   Acute ischemic multifocal posterior circulation stroke (HCC) 08/09/2023   Anxiety    panic attack- last one several yrs. ago   Arthritis    lumbar stenosis, hnp   BMI 36.0-36.9,adult    Chest pain in adult 03/02/2016   Formatting of this note might be  different from the original. Added automatically from request for surgery [id number]   Depression    Depression with anxiety    DLE (discoid lupus erythematosus)    Dyslipidemia    Essential hypertension    GERD (gastroesophageal reflux disease)    Gout, arthropathy    Hypercholesteremia    Hyperglycemia    Hypertension    ER visit for chest pain in past but pt. remarks that he was told that it was reflux    Hypokalemia 02/24/2016    Hypothyroidism    Insomnia, unspecified    Intermittent palpitations    Memory loss 01/22/2021   Mild CAD 03/23/2016   Formatting of this note might be different from the original. 35% proximal LAD   Multilevel degenerative disc disease    Obesity    Personal history of tobacco use, presenting hazards to health    PVC (premature ventricular contraction)    Shortness of breath    Sleep apnea    sleep study- results pending    Transient ischemic attack 08/08/2023   Ventricular premature depolarization 02/24/2016    Past Surgical History:  Procedure Laterality Date   ANKLE ARTHROSCOPY  2003   right   APPENDECTOMY     BACK SURGERY  1996; 1998; 01/2001; 04/22/11   lumbar   BRAIN SURGERY     INGUINAL HERNIA REPAIR  2003   right side   LUMBAR LAMINECTOMY/DECOMPRESSION MICRODISCECTOMY  04/22/2011   Procedure: LUMBAR LAMINECTOMY/DECOMPRESSION MICRODISCECTOMY;  Surgeon: Adelbert Adler;  Location: [facility];  Service: Neurosurgery;  Laterality: Left;  Left Lumbar five - sacral one Diskectomy   LUMBAR MICRODISCECTOMY  04/22/11   & foraminotomy; L5-S1; "my 4th back surgery"   NASAL SINUS SURGERY  late 1990's   /w trauma to brain during surgery ( done in [place]) transferred to [facility] & had reconstructive surgery on sinuses & brain      Current Medications: Current Meds  Medication Sig   albuterol (VENTOLIN HFA) 108 (90 Base) MCG/ACT inhaler Inhale 2 puffs into the lungs every 6 (six) hours as needed for wheezing or shortness of breath.   amLODipine  (NORVASC ) 10 MG tablet Take 10 mg by mouth daily.   aspirin  EC 81 MG tablet Take 81 mg by mouth daily.   carvedilol (COREG) 12.5 MG tablet Take 1 tablet (12.5 mg total) by mouth 2 (two) times daily.   celecoxib  (CELEBREX ) 200 MG capsule Take 1 capsule (200 mg total) by mouth 2 (two) times daily.   clopidogrel  (PLAVIX ) 75 MG tablet Take 1 tablet (75 mg total) by mouth daily.   empagliflozin (JARDIANCE) 25 MG TABS tablet Take 25 mg by  mouth daily.   ezetimibe  (ZETIA ) 10 MG tablet Take 1 tablet (10 mg total) by mouth daily.   levothyroxine (SYNTHROID) 125 MCG tablet Take 125 mcg by mouth daily.   losartan  (COZAAR ) 100 MG tablet Take 1 tablet (100 mg total) by mouth daily.   omeprazole (PRILOSEC) 40 MG capsule Take 40 mg by mouth daily.   PARoxetine  (PAXIL ) 40 MG tablet Take 40 mg by mouth daily.   Pitavastatin Calcium  (LIVALO) 4 MG TABS Take 4 mg by mouth daily.     Allergies:   Codeine, Hydrocodone, Niaspan [niacin], Vytorin [ezetimibe -simvastatin], and Morphine and codeine   Social History   Socioeconomic History   Marital status: Married    Spouse name: Not on file   Number of children: Not on file   Years of education: Not on file   Highest education  level: Not on file  Occupational History   Not on file  Tobacco Use   Smoking status: Former    Current packs/day: 0.00    Average packs/day: 1 pack/day for 43.0 years (43.0 ttl pk-yrs)    Types: Cigarettes    Quit date: 08/08/2023    Years since quitting: 0.0   Smokeless tobacco: Current    Types: Snuff   Tobacco comments:    consult entered  Substance and Sexual Activity   Alcohol use: Not Currently   Drug use: No   Sexual activity: Yes  Other Topics Concern   Not on file  Social History Narrative   Not on file   Social Drivers of Health   Financial Resource Strain: Not on file  Food Insecurity: No Food Insecurity (08/08/2023)   Hunger Vital Sign    Worried About Running Out of Food in the Last Year: Never true    Ran Out of Food in the Last Year: Never true  Transportation Needs: No Transportation Needs (08/08/2023)   PRAPARE - Administrator, Civil Service (Medical): No    Lack of Transportation (Non-Medical): No  Physical Activity: Not on file  Stress: Not on file  Social Connections: Socially Integrated (08/08/2023)   Social Connection and Isolation Panel [NHANES]    Frequency of Communication with Friends and Family: More than  three times Roy week    Frequency of Social Gatherings with Friends and Family: Once Roy week    Attends Religious Services: More than 4 times per year    Active Member of [employer] or Organizations: No    Attends Engineer, structural: 1 to 4 times per year    Marital Status: Married     Family History: The patient's family history includes Arthritis in his mother; Breast cancer in his daughter; Cerebrovascular Accident in his father; Colon cancer in his sister; Diabetes in his mother; Heart disease in his mother; Hyperlipidemia in his mother; Hypertension in his mother. There is no history of Anesthesia problems, Hypotension, Malignant hyperthermia, or Pseudochol deficiency. ROS:   Please see the history of present illness.    All 14 point review of systems negative except as described per history of present illness.  EKGs/Labs/Other Studies Reviewed:    The following studies were reviewed today:   EKG:       Recent Labs: 08/08/2023: ALT 21 08/09/2023: BUN 15; Creatinine, Ser 0.88; Hemoglobin 16.0; Platelets 233; Potassium 3.4; Sodium 141  Recent Lipid Panel    Component Value Date/Time   CHOL 168 08/09/2023 0610   TRIG 246 (H) 08/09/2023 0610   HDL 36 (L) 08/09/2023 0610   CHOLHDL 4.7 08/09/2023 0610   VLDL 49 (H) 08/09/2023 0610   LDLCALC 83 08/09/2023 0610    Physical Exam:    VS:  BP (!) 162/94   Pulse 80   Ht 5' 8.6" (1.742 m)   Wt 210 lb (95.3 kg)   SpO2 98%   BMI 31.37 kg/m     Wt Readings from Last 3 Encounters:  08/18/23 210 lb (95.3 kg)  08/08/23 208 lb (94.3 kg)  02/11/21 224 lb (101.6 kg)     GENERAL:  Well nourished, well developed in no acute distress NECK: No JVD; No carotid bruits CARDIAC: RRR, S1 and S2 present, no murmurs, no rubs, no gallops CHEST:  Clear to auscultation without rales, wheezing or rhonchi  Extremities: No pitting pedal edema. Pulses bilaterally symmetric with radial 2+ and dorsalis pedis 2+ NEUROLOGIC:  Alert and oriented x  3  Medication Adjustments/Labs and Tests Ordered: Current medicines are reviewed at length with the patient today.  Concerns regarding medicines are outlined above.  No orders of the defined types were placed in this encounter.  Meds ordered this encounter  Medications   carvedilol (COREG) 12.5 MG tablet    Sig: Take 1 tablet (12.5 mg total) by mouth 2 (two) times daily.    Dispense:  180 tablet    Refill:  3    Signed, Ramirez Fullbright reddy Cosandra Plouffe, MD, MPH, [facility]. 08/18/2023 5:21 PM    [facility]

## 2023-08-18 NOTE — Patient Instructions (Addendum)
 Medication Instructions:  Start Carvedilol 12.5 mg twice a day *If you need a refill on your cardiac medications before your next appointment, please call your pharmacy*   Lab Work: None Ordered If you have labs (blood work) drawn today and your tests are completely normal, you will receive your results only by: MyChart Message (if you have MyChart) OR A paper copy in the mail If you have any lab test that is abnormal or we need to change your treatment, we will call you to review the results.   Testing/Procedures: None Ordered   Follow-Up: At [facility], you and your health needs are our priority.  As part of our continuing mission to provide you with exceptional heart care, we have created designated Provider Care Teams.  These Care Teams include your primary Cardiologist (physician) and Advanced Practice Providers (APPs -  Physician Assistants and Nurse Practitioners) who all work together to provide you with the care you need, when you need it.  We recommend signing up for the patient portal called "MyChart".  Sign up information is provided on this After Visit Summary.  MyChart is used to connect with patients for Virtual Visits (Telemedicine).  Patients are able to view lab/test results, encounter notes, upcoming appointments, etc.  Non-urgent messages can be sent to your provider as well.   To learn more about what you can do with MyChart, go to [email or web address].    Your next appointment:   3 MONTH

## 2023-08-18 NOTE — Assessment & Plan Note (Signed)
 Recent acute stroke August 08, 2023. No residual neurological deficits. Pending follow-up with neurologist as outpatient. Incidence appears secondary to cerebrovascular disease.  Has 30-day event monitor set up at discharge from the hospital.  Will review results once available. Currently remains on dual antiplatelet therapy recommended for 3 months with aspirin  and Plavix  and subsequently has recommendations to continue Plavix .  Normal biventricular function on echocardiogram. However no agitated saline study performed.  With possible vascular disease as noted on CT coronary angiogram, low suspicion for cardiac sources of embolization to be any underlying etiology here. Will defer to neurologist to see if they would recommend long-term monitoring for A-fib.  For the 30 days study is uneventful. Will also similarly defer to neurologist if they would recommend further evaluation for cardiac sources of embolization secondary to PFO.

## 2023-08-18 NOTE — Assessment & Plan Note (Signed)
 Lipid panel from 08/09/2023 total cholesterol 168, triglycerides 246, HDL 36, LDL 83. Currently on Pitavastatin 4mg , a moderate intensity statin regimen.  Will reassess lipid panel at 46-month follow-up visit and if LDL not at goal will titrate up to high intensity statin therapy with atorvastatin or rosuvastatin .

## 2023-08-18 NOTE — Assessment & Plan Note (Signed)
 Suboptimal control. Target blood pressure below 130/80 mmHg. Continue amlodipine  10 mg once daily Continue losartan  100 mg once daily. Was previously on acebutolol . Will switch this to carvedilol 12.5 mg twice daily for better blood pressure optimization. In the past with hydrochlorothiazide  he did feel side effects of increased diuresis and self discontinued and prefers to hold off on this. Advised him to monitor blood pressures at home once a day for the next couple weeks after starting carvedilol to see blood pressure response.  If suboptimal we will titrate up carvedilol.

## 2023-08-30 NOTE — Progress Notes (Signed)
 Initial neurology clinic note  Gordon Roy MRN: [id number] DOB: May 22, 1960  Referring provider: Consuelo Denmark, MD  Primary care provider: Olga Berthold, NP  Reason for consult:  stroke  Subjective:  This is Mr. Gordon Roy, a 63 y.o. right-handed male with a medical history of stroke, TIA, HTN, HLD, DM, CAD, OSA, current smoker, OA, discoid lupus, concussion c/b left frontal encephalomalacia, lumbosacral spine disease s/p surgery, GERD, and former smoker who presents to neurology clinic with stroke. The patient is accompanied by wife.  Patient had acute onset headache, horizontal diplopia, and dizziness while driving on morning of 6/57/84. EMS was called. Symptoms resolved by time of exam (about 2 hours later) by neurology. Patient does not remember driving and getting a cup of coffee after symptom onset. People at work knew something wasn't right. He called 911, texted wife, talked to wife. She stated he wasn't making sense. He didn't remember his co-worker's name. He thinks he started understanding things by the time he went to the MRI brain.  CTH showed no acute process. CTA head and neck showed ~60% stenosis of LICA, moderate stenosis in right vertebral artery, severe stenosis in left AICA, RICA, LICA. MRI brain showed small acute infarcts in cerebellum, right occipital lobe, right thalamocapsular junction, and posterior temporal lobes. There was also concern for possible seizure, so EEG was done and was normal. Echo was normal. LDL was 83. Hypercoag workup was significant for mildly elevated anticardiolipin IgM (22). Protein S activity was also mildly elevated (likely not significant). Homocysteine, Lupus anticoagulant, and beta 2 glyco protein were normal.  Patient was already on asa 81 mg due to a TIA in 12/2022 (left arm paresthesias and disorientation). Plavix  75 mg daily was added for a plan of 3 months due to intracranial atherosclerosis. Patient is on pitavastatin 4 mg daily and  zetia  10 mg daily for HLD.  Since discharge, patient continues to have no symptoms. He did not have to complete therapy.  He was a smoker until the stroke. He has not smoked since 08/08/23.  Of note, patient has a history of spinal stenosis and had multiple back surgeries. He has chronic numbness and tingling in the right leg.  MEDICATIONS:  Outpatient Encounter Medications as of 09/13/2023  Medication Sig   albuterol (VENTOLIN HFA) 108 (90 Base) MCG/ACT inhaler Inhale 2 puffs into the lungs every 6 (six) hours as needed for wheezing or shortness of breath.   amLODipine  (NORVASC ) 10 MG tablet Take 10 mg by mouth daily.   aspirin  EC 81 MG tablet Take 81 mg by mouth daily.   carvedilol  (COREG ) 12.5 MG tablet Take 1 tablet (12.5 mg total) by mouth 2 (two) times daily.   celecoxib  (CELEBREX ) 200 MG capsule Take 1 capsule (200 mg total) by mouth 2 (two) times daily. (Patient taking differently: Take 200 mg by mouth daily.)   clopidogrel  (PLAVIX ) 75 MG tablet Take 1 tablet (75 mg total) by mouth daily.   empagliflozin (JARDIANCE) 25 MG TABS tablet Take 25 mg by mouth daily.   ezetimibe  (ZETIA ) 10 MG tablet Take 1 tablet (10 mg total) by mouth daily.   levothyroxine (SYNTHROID) 125 MCG tablet Take 125 mcg by mouth daily.   losartan  (COZAAR ) 100 MG tablet Take 1 tablet (100 mg total) by mouth daily.   omeprazole (PRILOSEC) 40 MG capsule Take 40 mg by mouth daily.   PARoxetine  (PAXIL ) 40 MG tablet Take 40 mg by mouth daily.   Pitavastatin Calcium  (LIVALO) 4 MG  TABS Take 4 mg by mouth daily.   [DISCONTINUED] clopidogrel  (PLAVIX ) 75 MG tablet Take 1 tablet (75 mg total) by mouth daily.   No facility-administered encounter medications on file as of 09/13/2023.    PAST MEDICAL HISTORY: Past Medical History:  Diagnosis Date   Acute ischemic multifocal posterior circulation stroke (HCC) 08/09/2023   Anxiety    panic attack- last one several yrs. ago   Arthritis    lumbar stenosis, hnp   BMI  36.0-36.9,adult    Chest pain in adult 03/02/2016   Formatting of this note might be different from the original. Added automatically from request for surgery [id number]   Depression    Depression with anxiety    DLE (discoid lupus erythematosus)    Dyslipidemia    Essential hypertension    GERD (gastroesophageal reflux disease)    Gout, arthropathy    Hypercholesteremia    Hyperglycemia    Hypertension    ER visit for chest pain in past but pt. remarks that he was told that it was reflux    Hypokalemia 02/24/2016   Hypothyroidism    Insomnia, unspecified    Intermittent palpitations    Memory loss 01/22/2021   Mild CAD 03/23/2016   Formatting of this note might be different from the original. 35% proximal LAD   Multilevel degenerative disc disease    Obesity    Personal history of tobacco use, presenting hazards to health    PVC (premature ventricular contraction)    Shortness of breath    Sleep apnea    sleep study- results pending    Transient ischemic attack 08/08/2023   Ventricular premature depolarization 02/24/2016    PAST SURGICAL HISTORY: Past Surgical History:  Procedure Laterality Date   ANKLE ARTHROSCOPY  2003   right   APPENDECTOMY     BACK SURGERY  1996; 1998; 01/2001; 04/22/11   lumbar   BRAIN SURGERY     INGUINAL HERNIA REPAIR  2003   right side   LUMBAR LAMINECTOMY/DECOMPRESSION MICRODISCECTOMY  04/22/2011   Procedure: LUMBAR LAMINECTOMY/DECOMPRESSION MICRODISCECTOMY;  Surgeon: Adelbert Adler;  Location: [facility];  Service: Neurosurgery;  Laterality: Left;  Left Lumbar five - sacral one Diskectomy   LUMBAR MICRODISCECTOMY  04/22/11   & foraminotomy; L5-S1; "my 4th back surgery"   NASAL SINUS SURGERY  late 1990's   /Roy trauma to brain during surgery ( done in [place]) transferred to [facility] & had reconstructive surgery on sinuses & brain      ALLERGIES: Allergies  Allergen Reactions   Codeine Itching   Hydrocodone Other (See Comments)     Numbness in face   Niaspan [Niacin] Other (See Comments)    Unknown reaction   Vytorin [Ezetimibe -Simvastatin] Other (See Comments)    Unknown reaction   Morphine And Codeine Itching    FAMILY HISTORY: Family History  Problem Relation Age of Onset   Diabetes Mother    Hypertension Mother    Hyperlipidemia Mother    Heart disease Mother    Arthritis Mother    Cerebrovascular Accident Father    Colon cancer Sister    Breast cancer Daughter        dx before age 47   Anesthesia problems Neg Hx    Hypotension Neg Hx    Malignant hyperthermia Neg Hx    Pseudochol deficiency Neg Hx     SOCIAL HISTORY: Social History   Tobacco Use   Smoking status: Former    Current packs/day: 0.00  Average packs/day: 1 pack/day for 43.0 years (43.0 ttl pk-yrs)    Types: Cigarettes    Quit date: 08/08/2023    Years since quitting: 0.0   Smokeless tobacco: Current    Types: Snuff   Tobacco comments:    consult entered stopped smoking 08/08/23  Vaping Use   Vaping status: Never Used  Substance Use Topics   Alcohol use: Not Currently   Drug use: No   Social History   Social History Narrative   Are you right handed or left handed? right   Are you currently employed ?    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   Who lives with you? Wife and children   What type of home do you live in: 1 story or 2 story? one    Caffiene 2 cups and 1 soda a day    Objective:  Vital Signs:  BP (!) 163/86   Pulse 69   Ht 5' 8.6" (1.742 m)   Wt 209 lb (94.8 kg)   SpO2 97%   BMI 31.22 kg/m   General: No acute distress.  Patient appears well-groomed.   Head:  Normocephalic/atraumatic Neck: supple, no paraspinal tenderness, full range of motion Back: No paraspinal tenderness Heart: regular rate and rhythm Lungs: Clear to auscultation bilaterally. Vascular: No carotid bruits.  Neurological Exam: Mental status: alert and oriented, speech fluent and not  dysarthric, language intact.  Cranial nerves: CN I: not tested CN II: pupils equal, round and reactive to light, visual fields intact CN III, IV, VI:  full range of motion, no nystagmus, no ptosis CN V: facial sensation intact. CN VII: upper and lower face symmetric CN VIII: hearing intact CN IX, X: uvula midline CN XI: sternocleidomastoid and trapezius muscles intact CN XII: tongue midline  Bulk & Tone: normal, no fasciculations. Motor:  muscle strength 5/5 throughout Deep Tendon Reflexes:  2+ throughout, except at right patella which is 1+ and trace at bilateral ankles.   Sensation:  Pinprick, vibratory, and proprioceptive sensation intact. Finger to nose testing:  Without dysmetria.    Gait:  Normal station and stride.  Romberg negative.   Labs and Imaging review: Internal labs: 08/09/23: CBC unremarkable BMP significant for glucose 144 Lipid panel: tChol 168, LDL 83, TG 246  08/08/23: HIV negative Cardiolipin abs: IgG and IgA wnl; IgM mildly elevated (22) Beta-2 -gycloprotein wnl Lupus anticoagulant wnl Protein S activity and total elevated Protein C activity and total wnl Homocysteine wnl HbA1c: 6.8  Imaging/Procedures: MRI lumbar spine wo contrast (02/27/21): IMPRESSION: 1. L3-L4 moderate to severe spinal canal stenosis, secondary to disc bulge with superimposed central extrusion. The lateral recesses are effaced at this level, likely compressing the descending L4 nerve roots, right greater than left. There is also mild right neural foraminal narrowing at this level. 2. Postsurgical changes at L4-L5, with focal fat noted at the left aspect of the thecal sac, possibly postsurgical changes, which causes mild spinal canal stenosis superior to the L4-L5 disc space and likely compresses the descending left L4 nerve. There is also moderate to severe right and mild left neural foraminal narrowing L4-L5. 3. L5-S1 severe left and mild right neural foraminal  narrowing.  MRI brain Roy/wo contrast (08/08/23): FINDINGS: Brain: Subcentimeter acute infarcts are present in the right and possibly left cerebellar hemispheres, right occipital lobe, posterior right hippocampus, posteromedial left temporal lobe, and right thalamocapsular junction. A chronic microhemorrhage in the right cerebellar hemisphere is unchanged from the  prior MRI. A moderate-sized region of encephalomalacia anteriorly in the left frontal lobe is again noted. Patchy T2 hyperintensities elsewhere in the cerebral white matter bilaterally are similar to the prior MRI and are nonspecific but compatible with moderate chronic small vessel ischemic disease. There are chronic lacunar infarcts in the right cerebellar hemisphere and pons. There is mild cerebral atrophy. No mass, midline shift, or extra-axial fluid collection is identified. No abnormal enhancement is identified.   Vascular: Major intracranial vascular flow voids are preserved.   Skull and upper cervical spine: Unremarkable bone marrow signal.   Sinuses/Orbits: Unremarkable orbits. Mucosal thickening in the paranasal sinuses, moderate in the left ethmoid sinus and mild elsewhere. Clear mastoid air cells.   Other: None.   IMPRESSION: 1. Subcentimeter acute infarcts in the cerebellum, right occipital lobe, right thalamocapsular junction, and posterior temporal lobes. 2. Moderate chronic small vessel ischemic disease. 3. Left frontal encephalomalacia, likely the sequelae of remote trauma.  CTA head and neck (08/08/23): IMPRESSION: CTA neck:   1. The common carotid and internal carotid arteries are patent within neck. Atherosclerotic plaque bilaterally, as described. Most notably, there is up to 60% stenosis of the proximal left internal carotid artery. 2. The non-dominant left vertebral artery is developmentally diminutive, but patent throughout the neck. 3. The dominant right vertebral artery is patent within the  neck. Calcified atherosclerotic plaque within the proximal right V1 segment with no more than mild stenosis. 4. Aortic Atherosclerosis (ICD10-I70.0). 5. Nonspecific asymmetric soft tissue prominence in the region of the right palatine tonsil. A mucosal neoplasm at this site cannot be excluded. ENT referral and direct visualization recommended.   CTA head:   1. No proximal intracranial large vessel occlusion identified. 2. Intracranial atherosclerotic disease with multifocal stenoses, most notably as follows. 3. Moderate stenosis within the right vertebral artery V4 segment. 4. Severe stenosis within the proximal left anterior inferior cerebellar artery. 5. Severe stenosis within the mid left superior cerebellar artery. 6. Severe stenosis within the paraclinoid right internal carotid artery. 7. Severe stenosis within the distal cavernous/paraclinoid left internal carotid artery.  EEG (08/08/23): IMPRESSION: This study is within normal limits. No seizures or epileptiform discharges were seen throughout the recording.  Echocardiogram (08/09/23):  1. Left ventricular ejection fraction, by estimation, is 60 to 65%. The  left ventricle has normal function. The left ventricle has no regional  wall motion abnormalities. Left ventricular diastolic parameters were  normal.   2. Right ventricular systolic function is normal. The right ventricular  size is normal.   3. The mitral valve is normal in structure. No evidence of mitral valve  regurgitation. No evidence of mitral stenosis.   4. The aortic valve is tricuspid. There is mild calcification of the  aortic valve. Aortic valve regurgitation is not visualized. Aortic valve  sclerosis/calcification is present, without any evidence of aortic  stenosis.   5. The inferior vena cava is normal in size with greater than 50%  respiratory variability, suggesting right atrial pressure of 3 mmHg.  LA normal in size.  Assessment/Plan:  Gordon Roy  Crisanti is a 63 y.o. male who presents for evaluation of recent strokes. He has a relevant medical history of stroke, TIA, HTN, HLD, DM, CAD, OSA, current smoker, OA, discoid lupus, concussion c/b left frontal encephalomalacia, lumbosacral spine disease s/p surgery, GERD, and former smoker. His neurological examination is essentially normal today. MRI brain on 08/08/23 showed multiple posterior circulation lacunar strokes and right basal ganglia stroke. His CTA head and neck showed stenosis  in right vertebral artery, left SCA, and bilateral paraclinoid ICA. His strokes are likely the result of small vessel disease and this known vasculopathy. His risk factors include smoking (stopped after this recent stroke), uncontrolled HTN, HLD, and DM.  PLAN: -Continue DAPT with asa 81 mg daily and plavix  75 mg for 3 months (through 11/07/23) then continue with plavix  75 mg daily thereafter as patient had strokes while on asa 81 mg monotherapy. -Continue Pitavastatin 4 mg daily and zetia  10 mg daily -Discussed the importance of stopping smoking, good blood pressure, diabetes, and cholesterol control -Will recheck LDL at next visit as goal would be < 70  -Return to clinic in about 3 months  The impression above as well as the plan as outlined below were extensively discussed with the patient (in the company of wife) who voiced understanding. All questions were answered to their satisfaction.  When available, results of the above investigations and possible further recommendations will be communicated to the patient via telephone/MyChart. Patient to call office if not contacted after expected testing turnaround time.   Total time spent reviewing records, interview, history/exam, documentation, and coordination of care on day of encounter:  60 min   Thank you for allowing me to participate in patient's care.  If I can answer any additional questions, I would be pleased to do so.  Rommie Coats, MD   CC: Olga Berthold, NP [address] [place] [address]  CC: Referring provider: Consuelo Denmark, MD [address] [place],  [place] [address]

## 2023-09-08 ENCOUNTER — Other Ambulatory Visit: Payer: Self-pay

## 2023-09-08 MED ORDER — CLOPIDOGREL BISULFATE 75 MG PO TABS: 75.0000 mg | ORAL_TABLET | Freq: Every day | ORAL | 11 refills | Status: AC

## 2023-09-08 MED ORDER — CLOPIDOGREL BISULFATE 75 MG PO TABS: 75.0000 mg | ORAL_TABLET | Freq: Every day | ORAL | 11 refills | Status: DC

## 2023-09-08 NOTE — Telephone Encounter (Signed)
 Pharmacy comment: INTERACTS WITH HIS OMEPRAZOLE. DO YOU WANT TO SWITCH?

## 2023-09-08 NOTE — Telephone Encounter (Signed)
 Pt is requesting a refill on medication clopidogrel . This medication was prescribed in the hospital. Would Dr. Ronell Coe like to refill this medication? Please address

## 2023-09-08 NOTE — Telephone Encounter (Signed)
*  STAT* If patient is at the pharmacy, call can be transferred to refill team.   1. Which medications need to be refilled? (please list name of each medication and dose if known) new  prescription for Plavix    2. Would you like to learn more about the convenience, safety, & potential cost savings by using the [facility]?     3. Are you open to using the [facility] (Type [facility].    4. Which pharmacy/location (including street and city if local pharmacy) is medication to be sent to? [facility] [address] [place]   5. Do they need a 30 day or 90 day supply? 30 days and refills please call today- out of medicine

## 2023-09-08 NOTE — Addendum Note (Signed)
 Addended by: Khyron Garno, Jyl Or L on: 09/08/2023 03:22 PM   Modules accepted: Orders

## 2023-09-08 NOTE — Telephone Encounter (Signed)
 Rx refill sent to pharmacy.

## 2023-09-13 ENCOUNTER — Ambulatory Visit (INDEPENDENT_AMBULATORY_CARE_PROVIDER_SITE_OTHER): Admitting: Neurology

## 2023-09-13 ENCOUNTER — Encounter: Payer: Self-pay | Admitting: Neurology

## 2023-09-13 VITALS — BP 163/86 | HR 69 | Ht 68.6 in | Wt 209.0 lb

## 2023-09-13 DIAGNOSIS — E785 Hyperlipidemia, unspecified: Secondary | ICD-10-CM

## 2023-09-13 DIAGNOSIS — E119 Type 2 diabetes mellitus without complications: Secondary | ICD-10-CM | POA: Diagnosis not present

## 2023-09-13 DIAGNOSIS — I10 Essential (primary) hypertension: Secondary | ICD-10-CM | POA: Diagnosis not present

## 2023-09-13 DIAGNOSIS — I6381 Other cerebral infarction due to occlusion or stenosis of small artery: Secondary | ICD-10-CM | POA: Diagnosis not present

## 2023-09-13 DIAGNOSIS — Z87891 Personal history of nicotine dependence: Secondary | ICD-10-CM

## 2023-09-13 NOTE — Patient Instructions (Signed)
 I saw you today for recent strokes on 08/08/23. These strokes were likely caused by vessel disease from smoking, high blood pressure, high cholesterol, and diabetes.  I want you to continue aspirin  81 mg daily and plavix  75 mg daily together until 11/08/23 then stop aspirin  and continue plavix  75 mg daily.  Continue your cholesterol medications (pitavastatin and zetia ). I will recheck your cholesterol at our next visit if no one else checks it before then.  It is important that you continue to not smoke and work with your primary care and cardiologist on controlling your blood pressure, cholesterol, and diabetes as well as you can as this will help reduce your increased risk of another stroke.  I will see you back in clinic in about 3 months. Please let me know if you have any questions or concerns in the meantime.   If you have new difficulty speaking, face droop, numbness on one side of the body, weakness on one side of the body, or dizziness/imbalance, this could be the sign of a stroke. Don't wait, please call EMS and be evaluated at the nearest emergency room.  The physicians and staff at [facility] are committed to providing excellent care. You may receive a survey requesting feedback about your experience at our office. We strive to receive "very good" responses to the survey questions. If you feel that your experience would prevent you from giving the office a "very good " response, please contact our office to try to remedy the situation. We may be reached at [phone]. Thank you for taking the time out of your busy day to complete the survey.  Rommie Coats, MD [facility]

## 2023-09-20 ENCOUNTER — Encounter: Payer: Self-pay | Admitting: Gastroenterology

## 2023-09-21 ENCOUNTER — Ambulatory Visit: Attending: Cardiology

## 2023-09-21 DIAGNOSIS — I63531 Cerebral infarction due to unspecified occlusion or stenosis of right posterior cerebral artery: Secondary | ICD-10-CM

## 2023-09-26 MED ORDER — CARVEDILOL 25 MG PO TABS: 25.0000 mg | ORAL_TABLET | Freq: Two times a day (BID) | ORAL | 3 refills | Status: AC

## 2023-11-08 ENCOUNTER — Telehealth: Payer: Self-pay

## 2023-11-08 ENCOUNTER — Encounter: Payer: Self-pay | Admitting: Gastroenterology

## 2023-11-08 ENCOUNTER — Telehealth: Payer: Self-pay | Admitting: *Deleted

## 2023-11-08 ENCOUNTER — Ambulatory Visit: Payer: Self-pay | Admitting: Gastroenterology

## 2023-11-08 VITALS — BP 124/78 | HR 71 | Ht 68.5 in | Wt 202.6 lb

## 2023-11-08 DIAGNOSIS — Z860100 Personal history of colon polyps, unspecified: Secondary | ICD-10-CM | POA: Diagnosis not present

## 2023-11-08 DIAGNOSIS — Z8673 Personal history of transient ischemic attack (TIA), and cerebral infarction without residual deficits: Secondary | ICD-10-CM

## 2023-11-08 DIAGNOSIS — Z800 Family history of malignant neoplasm of digestive organs: Secondary | ICD-10-CM | POA: Diagnosis not present

## 2023-11-08 DIAGNOSIS — R194 Change in bowel habit: Secondary | ICD-10-CM

## 2023-11-08 DIAGNOSIS — Z7902 Long term (current) use of antithrombotics/antiplatelets: Secondary | ICD-10-CM

## 2023-11-08 NOTE — Telephone Encounter (Signed)
 Dr. Liborio,  You saw this patient on 08/18/2023. Per protocol we request that you comment on his cardiac risk to proceed with EGD scheduled on TBD, since she is being treated for post multifocal CVA in  April 2025, with DAPT with ASA and Plavix . Please send your comment to P CV Pre-Op Pool.  Thank you, Lamarr Satterfield DNP, ANP, AACC.

## 2023-11-08 NOTE — Patient Instructions (Addendum)
 Recommend high fiber diet Start Citrucel 1 tsp po daily  _______________________________________________________  If your blood pressure at your visit was 140/90 or greater, please contact your primary care physician to follow up on this.  _______________________________________________________  If you are age 61 or older, your body mass index should be between 23-30. Your Body mass index is 30.36 kg/m. If this is out of the aforementioned range listed, please consider follow up with your Primary Care Provider.  If you are age 108 or younger, your body mass index should be between 19-25. Your Body mass index is 30.36 kg/m. If this is out of the aformentioned range listed, please consider follow up with your Primary Care Provider.   ________________________________________________________  The [facility] providers would like to encourage you to use MYCHART to communicate with providers for non-urgent requests or questions.  Due to long hold times on the telephone, sending your provider a message by [facility] may be a faster and more efficient way to get a response.  Please allow 48 business hours for a response.  Please remember that this is for non-urgent requests.  _______________________________________________________  Thank you for trusting me with your gastrointestinal care. Deanna May, RNP

## 2023-11-08 NOTE — Telephone Encounter (Signed)
   Patient Name: Gordon Roy  DOB: [birth year] MRN: [id number]  Primary Cardiologist: Madireddy   Chart reviewed as part of pre-operative protocol coverage. Given past medical history and time since last visit, based on ACC/AHA guidelines, Gordon Roy is at acceptable risk for the planned procedure without further cardiovascular testing.   Per Dr.Madireddy From cardiac standpoint no active indications for dual antiplatelet therapy. Her reason for dual antiplatelet therapy is recent CVA and per neurologist was supposed to continue on this for 3 months subsequently continued on Plavix .   I would defer interruption to dual antiplatelet therapy and choice of monotherapy to the neurologist at this time. No other limitations from cardiac standpoint to proceed with her EGD.    Patient is seen by Dr.  Ary Cummins for neurology. Please contact him for recommendations concerning Plavix  and ASA  The patient was advised that if he develops new symptoms prior to surgery to contact our office to arrange for a follow-up visit, and he verbalized understanding.  I will route this recommendation to the requesting party via Epic fax function and remove from pre-op pool.  Please call with questions.  Lamarr Satterfield, NP 11/08/2023, 1:06 PM

## 2023-11-08 NOTE — Progress Notes (Addendum)
 Chief Complaint: colon cancer screening Primary GI Doctor: Dr. Charlanne (per request)  HPI:  Patient is a  63  year old male patient with past medical history of Lupus,CAD, TIA, current smoker, OA, hyperlipidemia, hypothyroidism, and hypertension, who was referred to me by Erick Greig LABOR, NP on 09/04/23 for a complaint of colon cancer screening.     Presented with dizziness and double vision.  Code stroke called upon arrival in ED.  Initially thought to have been TIA.CTA neck notable for 60% stenosis of the proximal left internal carotid artery. CTA head notable for severe stenosis within the proximal left anterior inferior cerebellar artery, mid left superior cerebellar artery, paraclinoid right internal carotid artery, and the distal cavernous/paracclinoid left internal carotid artery.  MRI notable for subcentimeter acute infarcts in the cerebellum, right occipital.  lobe, right thalamocapsular junction, and posterior temporal lobes.  He was started on aspirin  and Plavix .  Echo unremarkable.  At discharge, back to his baseline.  History notable for lupus, so will follow-up with hypercoagulable labs at hospital follow-up. Will continue on DAPT for 3 months followed by Plavix  alone. Will follow-up with neurology in 6-8 weeks.  Neurology recommends 30 day cardiac monitor outpatient. ASA and Plavix  for 2 mths then Plavix  alone.   08/18/23 cardiology-Here for follow-up to discuss further management and outpatient event monitor to assess for cardiac arrhythmias and optimization for blood pressure medications that were suspended for permissive hypertension at recent inpatient stay and have not been resumed yet. Has 30-day event monitor set up at discharge from the hospital.  Will review results once available. Currently remains on dual antiplatelet therapy recommended for 3 months with aspirin  and Plavix  and subsequently has recommendations to continue Plavix . Reviewed entire note. 09/13/23 neurology- Continue DAPT  with asa 81 mg daily and plavix  75 mg for 3 months (through 11/07/23) then continue with plavix  75 mg daily thereafter as patient had strokes while on asa 81 mg monotherapy. His strokes are likely the result of small vessel disease and this known vasculopathy. His risk factors include smoking (stopped after this recent stroke), uncontrolled HTN, HLD, and DM. Reviewed entire note.   Interval History      Patient presents for evaluation of colon screening colonoscopy. His only GI complaint today is where he reports he has intermittent issues where he has to make back to back trips to bathroom. He has had a few episodes of explosive diarrhea. Patient reports he feels these issues started after his gallbladder was taken out 4-5 years ago. Patient reports most days he has 2 bowel movements daily. No blood in stool.  Patient has history of GERD and taking omeprazole 40 mg po daily. No dysphagia. Patient denies nausea, vomiting, or weight loss.   No alcohol use. Patient quit smoking in April after stroke. Patient reports the only lingering symptom from stroke has been problems with getting up first thing in the morning without falling back asleep.  Patient stopped baby ASA  81 mg po daily.  Patient on Plavix  75mg  po daily.   Patients last colonoscopy was approximately 6-7 years ago and he reports he had colonic polyps, he reports one prior he also had polyps at [facility].   Patients last EGD was approximately 10 years+ with Dr. Towana in [place] to r/o Barrett's due to history of GERD, pt reports it was normal.   Patient's family history includes sister with colon CA age 35, mother with colon CA, daughter with breast CA.   Patient pending results  of 30 day heart monitor. He has appt with cardiology 7/25. Appt with neurology in August.  Patients wife sees Dr. Charlanne as well.   Wt Readings from Last 3 Encounters:  11/08/23 202 lb 9.6 oz (91.9 kg)  09/13/23 209 lb (94.8 kg)  08/18/23 210 lb (95.3 kg)     Past Medical History:  Diagnosis Date   Acute ischemic multifocal posterior circulation stroke (HCC) 08/09/2023   Anxiety    panic attack- last one several yrs. ago   Arthritis    lumbar stenosis, hnp   BMI 36.0-36.9,adult    Chest pain in adult 03/02/2016   Formatting of this note might be different from the original. Added automatically from request for surgery [id number]   Depression    Depression with anxiety    DLE (discoid lupus erythematosus)    Dyslipidemia    Essential hypertension    GERD (gastroesophageal reflux disease)    Gout, arthropathy    Hypercholesteremia    Hyperglycemia    Hypertension    ER visit for chest pain in past but pt. remarks that he was told that it was reflux    Hypokalemia 02/24/2016   Hypothyroidism    Insomnia, unspecified    Intermittent palpitations    Memory loss 01/22/2021   Mild CAD 03/23/2016   Formatting of this note might be different from the original. 35% proximal LAD   Multilevel degenerative disc disease    Obesity    Personal history of tobacco use, presenting hazards to health    PVC (premature ventricular contraction)    Shortness of breath    Sleep apnea    sleep study- results pending    Transient ischemic attack 08/08/2023   Ventricular premature depolarization 02/24/2016    Past Surgical History:  Procedure Laterality Date   ANKLE ARTHROSCOPY  2003   right   APPENDECTOMY     BACK SURGERY  1996; 1998; 01/2001; 04/22/11   lumbar   BRAIN SURGERY     INGUINAL HERNIA REPAIR  2003   right side   LUMBAR LAMINECTOMY/DECOMPRESSION MICRODISCECTOMY  04/22/2011   Procedure: LUMBAR LAMINECTOMY/DECOMPRESSION MICRODISCECTOMY;  Surgeon: Catalina CHRISTELLA Stains;  Location: [facility];  Service: Neurosurgery;  Laterality: Left;  Left Lumbar five - sacral one Diskectomy   LUMBAR MICRODISCECTOMY  04/22/11   & foraminotomy; L5-S1; my 4th back surgery   NASAL SINUS SURGERY  late 1990's   /w trauma to brain during surgery ( done in  [place]) transferred to [facility] & had reconstructive surgery on sinuses & brain      Current Outpatient Medications  Medication Sig Dispense Refill   albuterol (VENTOLIN HFA) 108 (90 Base) MCG/ACT inhaler Inhale 2 puffs into the lungs every 6 (six) hours as needed for wheezing or shortness of breath.     amLODipine  (NORVASC ) 10 MG tablet Take 10 mg by mouth daily.     aspirin  EC 81 MG tablet Take 81 mg by mouth daily.     carvedilol  (COREG ) 25 MG tablet Take 1 tablet (25 mg total) by mouth 2 (two) times daily. 180 tablet 3   celecoxib  (CELEBREX ) 200 MG capsule Take 1 capsule (200 mg total) by mouth 2 (two) times daily. (Patient taking differently: Take 200 mg by mouth daily.) 180 capsule 3   clopidogrel  (PLAVIX ) 75 MG tablet Take 1 tablet (75 mg total) by mouth daily. 30 tablet 11   empagliflozin (JARDIANCE) 25 MG TABS tablet Take 25 mg by mouth daily.     ezetimibe  (  ZETIA ) 10 MG tablet Take 1 tablet (10 mg total) by mouth daily. 90 tablet 1   levothyroxine (SYNTHROID) 125 MCG tablet Take 125 mcg by mouth daily.     losartan  (COZAAR ) 100 MG tablet Take 1 tablet (100 mg total) by mouth daily. 90 tablet 3   omeprazole (PRILOSEC) 40 MG capsule Take 40 mg by mouth daily.     PARoxetine  (PAXIL ) 40 MG tablet Take 40 mg by mouth daily.     Pitavastatin Calcium  (LIVALO) 4 MG TABS Take 4 mg by mouth daily.     No current facility-administered medications for this visit.    Allergies as of 11/08/2023 - Review Complete 11/08/2023  Allergen Reaction Noted   Codeine Itching 09/18/2019   Hydrocodone Other (See Comments) 04/20/2011   Niaspan [niacin] Other (See Comments) 09/18/2019   Vytorin [ezetimibe -simvastatin] Other (See Comments)    Morphine and codeine Itching 04/20/2011    Family History  Problem Relation Age of Onset   Diabetes Mother    Hypertension Mother    Hyperlipidemia Mother    Heart disease Mother    Arthritis Mother    Cerebrovascular Accident Father    Colon cancer Sister     Breast cancer Daughter        dx before age 14   Anesthesia problems Neg Hx    Hypotension Neg Hx    Malignant hyperthermia Neg Hx    Pseudochol deficiency Neg Hx     Review of Systems:    Constitutional: No weight loss, fever, chills, weakness or fatigue HEENT: Eyes: No change in vision               Ears, Nose, Throat:  No change in hearing or congestion Skin: No rash or itching Cardiovascular: No chest pain, chest pressure or palpitations   Respiratory: No SOB or cough Gastrointestinal: See HPI and otherwise negative Genitourinary: No dysuria or change in urinary frequency Neurological: No headache, dizziness or syncope Musculoskeletal: No new muscle or joint pain Hematologic: No bleeding or bruising Psychiatric: No history of depression or anxiety    Physical Exam:  Vital signs: BP 124/78   Pulse 71   Ht 5' 8.5 (1.74 m)   Wt 202 lb 9.6 oz (91.9 kg)   SpO2 98%   BMI 30.36 kg/m   Constitutional:   Pleasant  male appears to be in NAD, Well developed, Well nourished, alert and cooperative Throat: Oral cavity and pharynx without inflammation, swelling or lesion.  Respiratory: Respirations even and unlabored. Lungs clear to auscultation bilaterally.   No wheezes, crackles, or rhonchi.  Cardiovascular: Normal S1, S2. Regular rate and rhythm. No peripheral edema, cyanosis or pallor.  Gastrointestinal:  Soft, nondistended, nontender. No rebound or guarding. Normal bowel sounds. No appreciable masses or hepatomegaly. Rectal:  Not performed.  Msk:  Symmetrical without gross deformities. Without edema, no deformity or joint abnormality.  Neurologic:  Alert and  oriented x4;  grossly normal neurologically.  Skin:   Dry and intact without significant lesions or rashes. Psychiatric: Oriented to person, place and time. Demonstrates good judgement and reason without abnormal affect or behaviors.  RELEVANT LABS AND IMAGING: CBC    Latest Ref Rng & Units 08/09/2023    6:10 AM  08/08/2023   10:12 AM 08/08/2023   10:10 AM  CBC  WBC 4.0 - 10.5 K/uL 8.8   12.2   Hemoglobin 13.0 - 17.0 g/dL 83.9  82.6  82.5   Hematocrit 39.0 - 52.0 % 46.5  51.0  50.6   Platelets 150 - 400 K/uL 233   264      CMP     Latest Ref Rng & Units 08/09/2023    6:10 AM 08/08/2023   10:12 AM 08/08/2023   10:10 AM  CMP  Glucose 70 - 99 mg/dL 855  818  817   BUN 8 - 23 mg/dL 15  13  12    Creatinine 0.61 - 1.24 mg/dL 9.11  9.19  9.17   Sodium 135 - 145 mmol/L 141  139  137   Potassium 3.5 - 5.1 mmol/L 3.4  3.7  3.6   Chloride 98 - 111 mmol/L 106  103  103   CO2 22 - 32 mmol/L 23   23   Calcium  8.9 - 10.3 mg/dL 9.2   9.3   Total Protein 6.5 - 8.1 g/dL   7.9   Total Bilirubin 0.0 - 1.2 mg/dL   0.8   Alkaline Phos 38 - 126 U/L   80   AST 15 - 41 U/L   20   ALT 0 - 44 U/L   21      Lab Results  Component Value Date   TSH 3.40 01/22/2021   MRI brain w/wo contrast (08/08/23): FINDINGS: Brain: Subcentimeter acute infarcts are present in the right and possibly left cerebellar hemispheres, right occipital lobe, posterior right hippocampus, posteromedial left temporal lobe, and right thalamocapsular junction. A chronic microhemorrhage in the right cerebellar hemisphere is unchanged from the prior MRI. A moderate-sized region of encephalomalacia anteriorly in the left frontal lobe is again noted. Patchy T2 hyperintensities elsewhere in the cerebral white matter bilaterally are similar to the prior MRI and are nonspecific but compatible with moderate chronic small vessel ischemic disease. There are chronic lacunar infarcts in the right cerebellar hemisphere and pons. There is mild cerebral atrophy. No mass, midline shift, or extra-axial fluid collection is identified. No abnormal enhancement is identified.   Vascular: Major intracranial vascular flow voids are preserved.   Skull and upper cervical spine: Unremarkable bone marrow signal.   Sinuses/Orbits: Unremarkable orbits. Mucosal  thickening in the paranasal sinuses, moderate in the left ethmoid sinus and mild elsewhere. Clear mastoid air cells.   Other: None. IMPRESSION: 1. Subcentimeter acute infarcts in the cerebellum, right occipital lobe, right thalamocapsular junction, and posterior temporal lobes. 2. Moderate chronic small vessel ischemic disease. 3. Left frontal encephalomalacia, likely the sequelae of remote trauma.   CTA head and neck (08/08/23): IMPRESSION: CTA neck:   1. The common carotid and internal carotid arteries are patent within neck. Atherosclerotic plaque bilaterally, as described. Most notably, there is up to 60% stenosis of the proximal left internal carotid artery. 2. The non-dominant left vertebral artery is developmentally diminutive, but patent throughout the neck. 3. The dominant right vertebral artery is patent within the neck. Calcified atherosclerotic plaque within the proximal right V1 segment with no more than mild stenosis. 4. Aortic Atherosclerosis (ICD10-I70.0). 5. Nonspecific asymmetric soft tissue prominence in the region of the right palatine tonsil. A mucosal neoplasm at this site cannot be excluded. ENT referral and direct visualization recommended.   CTA head:   1. No proximal intracranial large vessel occlusion identified. 2. Intracranial atherosclerotic disease with multifocal stenoses, most notably as follows. 3. Moderate stenosis within the right vertebral artery V4 segment. 4. Severe stenosis within the proximal left anterior inferior cerebellar artery. 5. Severe stenosis within the mid left superior cerebellar artery. 6. Severe stenosis within the paraclinoid right internal carotid artery. 7.  Severe stenosis within the distal cavernous/paraclinoid left internal carotid artery.   EEG (08/08/23): IMPRESSION: This study is within normal limits. No seizures or epileptiform discharges were seen throughout the recording.   Echocardiogram (08/09/23):  1.  Left ventricular ejection fraction, by estimation, is 60 to 65%. The  left ventricle has normal function. The left ventricle has no regional  wall motion abnormalities. Left ventricular diastolic parameters were  normal.   2. Right ventricular systolic function is normal. The right ventricular  size is normal.   3. The mitral valve is normal in structure. No evidence of mitral valve  regurgitation. No evidence of mitral stenosis.   4. The aortic valve is tricuspid. There is mild calcification of the  aortic valve. Aortic valve regurgitation is not visualized. Aortic valve  sclerosis/calcification is present, without any evidence of aortic  stenosis.   5. The inferior vena cava is normal in size with greater than 50%  respiratory variability, suggesting right atrial pressure of 3 mmHg.  LA normal in size.  Assessment: Encounter Diagnoses  Name Primary?   History of colonic polyps Yes   Family history of colon cancer    Altered bowel habits      63 year old male patient with history of colonic polyps and family history of colon cancer in sibling and mother.  Patient due for colon screening colonoscopy.  Unfortunately patient recently had stroke in April and was placed on Baby aspirin  and Plavix .  The baby aspirin  has been discontinued but patient continued on Plavix .  Patient also had 30-day cardiac monitor and pending results at his next follow-up in a few weeks.  We discussed we would need clearance from cardiology and neurology for the Plavix .  Will also send message to Norleen Schillings in our [facility].  Will schedule patient for colonoscopy once cleared.  Will go ahead and have patient start high-fiber diet with fiber supplementation to see if this helps with consistency of his bowels.  Kayhan Boardley consider cholestyramine in the future however concern it Sheyanne Munley cause constipation in his particular case.   Plan: -Start Citrucel 1 tsp po daily -Commend high-fiber diet -schedule colonoscopy once cleared from  cardiology and neurology.  Patient pending results of 30-day monitor as well as he was recently placed on Plavix  for stroke about 3 months ago. -per Norleen Schillings- needs to be cleared by cardiology. -request records form Dr. Anette EGD/colon  Thank you for the courtesy of this consult. Please call me with any questions or concerns.   Isael Stille, FNP-C [facility] 11/08/2023, 9:34 AM  Cc: Erick Greig LABOR, NP

## 2023-11-08 NOTE — Telephone Encounter (Signed)
 Request for surgical clearance:     Endoscopy Procedure  What type of surgery is being performed?     endoscopic  When is this surgery scheduled?     TBD  What type of clearance is required ? Cardiac/ Pharmacy  Are there any medications that need to be held prior to surgery and how long? Plavix  5 days  Practice name and name of physician performing surgery?      [facility]  What is your office phone and fax number?      Phone- [phone]  Fax- [phone]  Anesthesia type (None, local, MAC, general) ?       MAC  Please route your response to [facility]

## 2023-11-08 NOTE — Telephone Encounter (Signed)
  Gordon Roy 1960-10-14 [id number]  11/08/23    Dear Dr. Jerri:  We have scheduled the above named patient for a(n) colonoscopy procedure. Our records show that (s)he is on anticoagulation therapy.  Please advise as to whether the patient may come off their therapy of Plavix  5 days prior to their procedure which is scheduled for TBD.  Please route your response to Powell Misty, CMA or fax response to [phone].  Sincerely,   Powell Misty, [facility]

## 2023-11-13 NOTE — Telephone Encounter (Signed)
 Had recent stroke on 08/08/2023 on Plavix  Neurology not comfortable in stopping Plavix  as he is high risk for another stroke. Patient is for screening colonoscopy (had previous polyps but reports not available) I think we should wait for at least 1 year after stroke unless he has any active problems/GI bleeding After 1 year mark, please get clearance from neurology.  I think at that time, neurology will be comfortable holding Plavix  for 5 days prior. RG  DJM, Any thoughts RG

## 2023-11-13 NOTE — Telephone Encounter (Signed)
 Please see response on Plavix  hold and advise on how to proceed. Thank you

## 2023-11-14 NOTE — Telephone Encounter (Signed)
 Agreed.  Thanks for following up RG

## 2023-11-15 NOTE — Telephone Encounter (Signed)
 Recall placed for colonoscopy 07/2024.

## 2023-11-16 DIAGNOSIS — Z87891 Personal history of nicotine dependence: Secondary | ICD-10-CM | POA: Insufficient documentation

## 2023-11-17 ENCOUNTER — Ambulatory Visit

## 2023-11-17 VITALS — BP 124/78 | HR 74 | Ht 68.6 in | Wt 202.2 lb

## 2023-11-17 DIAGNOSIS — I10 Essential (primary) hypertension: Secondary | ICD-10-CM

## 2023-11-17 DIAGNOSIS — I2510 Atherosclerotic heart disease of native coronary artery without angina pectoris: Secondary | ICD-10-CM

## 2023-11-17 DIAGNOSIS — I493 Ventricular premature depolarization: Secondary | ICD-10-CM

## 2023-11-17 DIAGNOSIS — I63531 Cerebral infarction due to unspecified occlusion or stenosis of right posterior cerebral artery: Secondary | ICD-10-CM

## 2023-11-17 DIAGNOSIS — E7800 Pure hypercholesterolemia, unspecified: Secondary | ICD-10-CM | POA: Diagnosis not present

## 2023-11-17 MED ORDER — EZETIMIBE 10 MG PO TABS: 10.0000 mg | ORAL_TABLET | Freq: Every day | ORAL | 3 refills | Status: AC

## 2023-11-17 NOTE — Patient Instructions (Signed)
 Medication Instructions:  Your physician recommends that you continue on your current medications as directed. Please refer to the Current Medication list given to you today.    *If you need a refill on your cardiac medications before your next appointment, please call your pharmacy*  Lab Work: Your physician recommends that you return for lab work in: Next few days for Fasting Lipid Panel Lab opens at 8am. You DO NOT NEED an appointment. Best time to come is between 8am and 11:45 and between 1:30 and 4:30. If you have been asked to fast for your blood work please have nothing to eat or drink after midnight. You may have water.   If you have labs (blood work) drawn today and your tests are completely normal, you will receive your results only by: MyChart Message (if you have MyChart) OR A paper copy in the mail If you have any lab test that is abnormal or we need to change your treatment, we will call you to review the results.  Testing/Procedures: NONE  Follow-Up: At [facility], you and your health needs are our priority.  As part of our continuing mission to provide you with exceptional heart care, our providers are all part of one team.  This team includes your primary Cardiologist (physician) and Advanced Practice Providers or APPs (Physician Assistants and Nurse Practitioners) who all work together to provide you with the care you need, when you need it.  Your next appointment:   9-10 month(s)  Provider:   Alean Kobus, MD    We recommend signing up for the patient portal called MyChart.  Sign up information is provided on this After Visit Summary.  MyChart is used to connect with patients for Virtual Visits (Telemedicine).  Patients are able to view lab/test results, encounter notes, upcoming appointments, etc.  Non-urgent messages can be sent to your provider as well.   To learn more about what you can do with MyChart, go to [email or web address].   Other  Instructions

## 2023-11-17 NOTE — Assessment & Plan Note (Signed)
 No significant PVC burden on recent 30-day heart monitor from April 2025.  Tolerating current dose of carvedilol  25 mg twice daily well with improved blood pressure control. Continue the same.

## 2023-11-17 NOTE — Progress Notes (Signed)
 Cardiology Consultation:    Date:  11/17/2023   ID:  Gordon Roy, DOB 12/28/1960, MRN [id number]  PCP:  Gordon Greig LABOR, NP  Cardiologist:  Gordon JONELLE Kobus, MD   Referring MD: Gordon Greig LABOR, NP   No chief complaint on file.    ASSESSMENT AND PLAN:   Mr. 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  1 short asymptomatic run of NSVT.  No significant ventricular or supraventricular ectopy burden.  Problem List Items Addressed This Visit     Mild CAD - Primary   Minimal nonobstructive CAD with mid LAD 35% stenosis on cardiac cath normal 2017. Stress test with nuclear imaging June 2021 showed no ischemia. Remains asymptomatic, good functional status.  Continue with Plavix  75 mg once daily [aspirin  discontinued November 07, 2023]. Lipid-lowering therapy as discussed below under hyperlipidemia.      Relevant Medications   ezetimibe  (ZETIA ) 10 MG tablet   Essential hypertension   Well-controlled. Continue with current medications amlodipine  10 mg once daily Losartan  100 mg once daily Carvedilol  25 mg twice daily.      Relevant Medications   ezetimibe  (ZETIA ) 10 MG tablet   Hypercholesteremia   Follow-up with fasting lipid panel tentatively in the next 1 to 2 weeks. Continue with atorvastatin 4 mg once daily and Zetia  10 mg once daily.  If  LDL not at goal below 70 mg/dL would recommend PCSK9 inhibitor such as Repatha or Praluent.  They are agreeable with this plan.      Relevant Medications   ezetimibe  (ZETIA ) 10 MG tablet   Other Relevant Orders   Lipid panel   PVC (premature ventricular contraction), 8.6% burden on Zio patch May 2021   No significant PVC burden on recent 30-day heart monitor from April 2025.  Tolerating current dose of carvedilol  25 mg twice daily well with improved blood pressure control. Continue the same.       Relevant Medications   ezetimibe  (ZETIA ) 10 MG tablet    Return for follow-up tentatively 9 to 10 months.  History of Present Illness:    Gordon Roy is Roy 63 y.o. male who is being seen today for follow-up visit. PCP is [name], [name], NP. Last visit with me in the office was 08-18-2023.  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  1 run of NSVT lasting 7 beats recorded.  3 auto triggered events correlated with sinus rhythm and 1 incidence of NSVT lasting 7 beats.  No PACs or PVCs.  Reviewed the tracings myself.  Here for the visit today accompanied by his wife. Mentions overall he has been doing fine.  Functional status is somewhat limited due to  chronic back pain Mentions they have stopped aspirin  11/07/2023 and have been continuing Plavix  uninterrupted.  Have been overall doing well.  Good compliance with medications.  Has not been smoking. Has not been using CPAP mentions since he sleeps reclined he feels his sleep is better and does not use it anymore.   Past Medical History:  Diagnosis Date   Acute ischemic multifocal posterior circulation stroke (HCC) 08/09/2023   Anxiety    panic attack- last one several yrs. ago   Arthritis    lumbar stenosis, hnp   BMI 36.0-36.9,adult    Chest pain in adult 03/02/2016   Formatting of this note might be different from the original. Added automatically from request for surgery [id number]   CVA (cerebral vascular accident) (HCC) 08/09/2023   Depression    Depression with anxiety    DLE (discoid lupus erythematosus)    Dyslipidemia    Essential hypertension    Former smoker 11/13/2020   GERD (gastroesophageal reflux disease)    Gout, arthropathy    Hypercholesteremia    Hyperglycemia    Hypertension    ER visit for chest pain in past but pt. remarks that he was told that it was reflux    Hypokalemia 02/24/2016   Hypothyroidism    Insomnia, unspecified    Intermittent palpitations    Memory loss 01/22/2021   Mild CAD 03/23/2016   Formatting of this note might be different from the original. 35% proximal LAD   Multilevel degenerative disc disease    Obesity    Personal history of tobacco use, presenting hazards to health    PVC (premature ventricular contraction)    Shortness of breath    Sleep apnea    sleep study- results pending    Transient ischemic attack 08/08/2023   Ventricular premature depolarization 02/24/2016    Past Surgical History:  Procedure Laterality Date   ANKLE ARTHROSCOPY  2003   right   APPENDECTOMY     BACK SURGERY  1996; 1998; 01/2001; 04/22/11   lumbar   BRAIN SURGERY     INGUINAL HERNIA REPAIR  2003   right side   LUMBAR  LAMINECTOMY/DECOMPRESSION MICRODISCECTOMY  04/22/2011   Procedure: LUMBAR LAMINECTOMY/DECOMPRESSION MICRODISCECTOMY;  Surgeon: Catalina CHRISTELLA Stains;  Location: [facility];  Service: Neurosurgery;  Laterality: Left;  Left Lumbar five - sacral one Diskectomy   LUMBAR MICRODISCECTOMY  04/22/11   & foraminotomy; L5-S1; my 4th back surgery   NASAL SINUS SURGERY  late 1990's   /w trauma to brain during surgery ( done in [place]) transferred to [facility] & had reconstructive surgery on sinuses & brain      Current Medications: Current Meds  Medication Sig   albuterol (VENTOLIN HFA) 108 (90 Base) MCG/ACT inhaler Inhale 2 puffs into the lungs every 6 (six) hours as needed for wheezing or shortness of breath.   amLODipine  (NORVASC ) 10 MG tablet Take 10 mg by mouth daily.   carvedilol  (COREG ) 25 MG tablet Take 1 tablet (25 mg total) by mouth 2 (two) times daily.   celecoxib  (CELEBREX ) 200 MG capsule Take 1 capsule (200 mg  total) by mouth 2 (two) times daily.   clopidogrel  (PLAVIX ) 75 MG tablet Take 1 tablet (75 mg total) by mouth daily.   empagliflozin (JARDIANCE) 25 MG TABS tablet Take 25 mg by mouth daily.   levothyroxine (SYNTHROID) 125 MCG tablet Take 125 mcg by mouth daily.   losartan  (COZAAR ) 100 MG tablet Take 1 tablet (100 mg total) by mouth daily.   omeprazole (PRILOSEC) 40 MG capsule Take 40 mg by mouth daily.   PARoxetine  (PAXIL ) 40 MG tablet Take 40 mg by mouth daily.   Pitavastatin Calcium  (LIVALO) 4 MG TABS Take 4 mg by mouth daily.   [DISCONTINUED] ezetimibe  (ZETIA ) 10 MG tablet Take 1 tablet (10 mg total) by mouth daily.     Allergies:   Codeine, Hydrocodone, Niaspan [niacin], Vytorin [ezetimibe -simvastatin], and Morphine and codeine   Social History   Socioeconomic History   Marital status: Married    Spouse name: Not on file   Number of children: Not on file   Years of education: Not on file   Highest education level: Not on file  Occupational History   Not on file  Tobacco  Use   Smoking status: Former    Current packs/day: 0.00    Average packs/day: 1 pack/day for 43.0 years (43.0 ttl pk-yrs)    Types: Cigarettes    Quit date: 08/08/2023    Years since quitting: 0.2   Smokeless tobacco: Current    Types: Snuff   Tobacco comments:    consult entered stopped smoking 08/08/23  Vaping Use   Vaping status: Never Used  Substance and Sexual Activity   Alcohol use: Not Currently   Drug use: No   Sexual activity: Yes  Other Topics Concern   Not on file  Social History Narrative   Are you right handed or left handed? right   Are you currently employed ?    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   Who lives with you? Wife and children   What type of home do you live in: 1 story or 2 story? one    Caffiene 2 cups and 1 soda Roy day   Social Drivers of Corporate investment banker Strain: Not on file  Food Insecurity: No Food Insecurity (08/08/2023)   Hunger Vital Sign    Worried About Running Out of Food in the Last Year: Never true    Ran Out of Food in the Last Year: Never true  Transportation Needs: No Transportation Needs (08/08/2023)   PRAPARE - Administrator, Civil Service (Medical): No    Lack of Transportation (Non-Medical): No  Physical Activity: Not on file  Stress: Not on file  Social Connections: Socially Integrated (08/08/2023)   Social Connection and Isolation Panel    Frequency of Communication with Friends and Family: More than three times Roy week    Frequency of Social Gatherings with Friends and Family: Once Roy week    Attends Religious Services: More than 4 times per year    Active Member of [employer] or Organizations: No    Attends Engineer, structural: 1 to 4 times per year    Marital Status: Married     Family History: The patient's family history includes Arthritis in his mother; Breast cancer in his daughter; Cerebrovascular Accident in his father; Colon cancer in his sister;  Diabetes in his mother; Heart disease in his mother; Hyperlipidemia in his mother; Hypertension in his  mother. There is no history of Anesthesia problems, Hypotension, Malignant hyperthermia, or Pseudochol deficiency. ROS:   Please see the history of present illness.    All 14 point review of systems negative except as described per history of present illness.  EKGs/Labs/Other Studies Reviewed:    The following studies were reviewed today:   EKG:       Recent Labs: 08/08/2023: ALT 21 08/09/2023: BUN 15; Creatinine, Ser 0.88; Hemoglobin 16.0; Platelets 233; Potassium 3.4; Sodium 141  Recent Lipid Panel    Component Value Date/Time   CHOL 168 08/09/2023 0610   TRIG 246 (H) 08/09/2023 0610   HDL 36 (L) 08/09/2023 0610   CHOLHDL 4.7 08/09/2023 0610   VLDL 49 (H) 08/09/2023 0610   LDLCALC 83 08/09/2023 0610    Physical Exam:    VS:  BP 124/78   Pulse 74   Ht 5' 8.6 (1.742 m)   Wt 202 lb 3.2 oz (91.7 kg)   SpO2 95%   BMI 30.21 kg/m     Wt Readings from Last 3 Encounters:  11/17/23 202 lb 3.2 oz (91.7 kg)  11/08/23 202 lb 9.6 oz (91.9 kg)  09/13/23 209 lb (94.8 kg)     GENERAL:  Well nourished, well developed in no acute distress NECK: No JVD; No carotid bruits CARDIAC: RRR, S1 and S2 present, no murmurs, no rubs, no gallops CHEST:  Clear to auscultation without rales, wheezing or rhonchi  Extremities: No pitting pedal edema. Pulses bilaterally symmetric with radial 2+ and dorsalis pedis 2+ NEUROLOGIC:  Alert and oriented x 3  Medication Adjustments/Labs and Tests Ordered: Current medicines are reviewed at length with the patient today.  Concerns regarding medicines are outlined above.  Orders Placed This Encounter  Procedures   Lipid panel   Meds ordered this encounter  Medications   ezetimibe  (ZETIA ) 10 MG tablet    Sig: Take 1 tablet (10 mg total) by mouth daily.    Dispense:  90 tablet    Refill:  3    Signed, Bert Givans reddy Rebeca Valdivia, MD, MPH,  [facility]. 11/17/2023 5:05 PM    [facility]

## 2023-11-17 NOTE — Assessment & Plan Note (Addendum)
 Minimal nonobstructive CAD with mid LAD 35% stenosis on cardiac cath normal 2017. Stress test with nuclear imaging June 2021 showed no ischemia. Remains asymptomatic, good functional status.  Continue with Plavix  75 mg once daily [aspirin  discontinued November 07, 2023]. Lipid-lowering therapy as discussed below under hyperlipidemia.

## 2023-11-17 NOTE — Assessment & Plan Note (Signed)
 Well-controlled. Continue with current medications amlodipine  10 mg once daily Losartan  100 mg once daily Carvedilol  25 mg twice daily.

## 2023-11-17 NOTE — Assessment & Plan Note (Signed)
 Follow-up with fasting lipid panel tentatively in the next 1 to 2 weeks. Continue with atorvastatin 4 mg once daily and Zetia  10 mg once daily.  If LDL not at goal below 70 mg/dL would recommend PCSK9 inhibitor such as Repatha or Praluent.  They are agreeable with this plan.

## 2023-11-20 ENCOUNTER — Ambulatory Visit: Payer: Self-pay | Admitting: Cardiology

## 2023-12-04 NOTE — Progress Notes (Unsigned)
 NEUROLOGY FOLLOW UP OFFICE NOTE  Garv Kuechle Meter [id number]  Subjective:  Gordon Roy is a 63 y.o. year old right-handed male with a medical history of stroke, TIA, HTN, HLD, DM, CAD, OSA, current smoker, OA, discoid lupus, concussion c/b left frontal encephalomalacia, lumbosacral spine disease s/p surgery, GERD, and former smoker who we last saw on 09/13/23 for stroke.  To briefly review: 09/13/23: Patient had acute onset headache, horizontal diplopia, and dizziness while driving on morning of 5/84/74. EMS was called. Symptoms resolved by time of exam (about 2 hours later) by neurology. Patient does not remember driving and getting a cup of coffee after symptom onset. People at work knew something wasn't right. He called 911, texted wife, talked to wife. She stated he wasn't making sense. He didn't remember his co-worker's name. He thinks he started understanding things by the time he went to the MRI brain.   CTH showed no acute process. CTA head and neck showed ~60% stenosis of LICA, moderate stenosis in right vertebral artery, severe stenosis in left AICA, RICA, LICA. MRI brain showed small acute infarcts in cerebellum, right occipital lobe, right thalamocapsular junction, and posterior temporal lobes. There was also concern for possible seizure, so EEG was done and was normal. Echo was normal. LDL was 83. Hypercoag workup was significant for mildly elevated anticardiolipin IgM (22). Protein S activity was also mildly elevated (likely not significant). Homocysteine, Lupus anticoagulant, and beta 2 glyco protein were normal.   Patient was already on asa 81 mg due to a TIA in 12/2022 (left arm paresthesias and disorientation). Plavix  75 mg daily was added for a plan of 3 months due to intracranial atherosclerosis. Patient is on pitavastatin 4 mg daily and zetia  10 mg daily for HLD.   Since discharge, patient continues to have no symptoms. He did not have to complete therapy.   He was a smoker  until the stroke. He has not smoked since 08/08/23.   Of note, patient has a history of spinal stenosis and had multiple back surgeries. He has chronic numbness and tingling in the right leg.  Most recent Assessment and Plan (09/13/23): Gordon Roy is a 63 y.o. male who presents for evaluation of recent strokes. He has a relevant medical history of stroke, TIA, HTN, HLD, DM, CAD, OSA, current smoker, OA, discoid lupus, concussion c/b left frontal encephalomalacia, lumbosacral spine disease s/p surgery, GERD, and former smoker. His neurological examination is essentially normal today. MRI brain on 08/08/23 showed multiple posterior circulation lacunar strokes and right basal ganglia stroke. His CTA head and neck showed stenosis in right vertebral artery, left SCA, and bilateral paraclinoid ICA. His strokes are likely the result of small vessel disease and this known vasculopathy. His risk factors include smoking (stopped after this recent stroke), uncontrolled HTN, HLD, and DM.   PLAN: -Continue DAPT with asa 81 mg daily and plavix  75 mg for 3 months (through 11/07/23) then continue with plavix  75 mg daily thereafter as patient had strokes while on asa 81 mg monotherapy. -Continue Pitavastatin 4 mg daily and zetia  10 mg daily -Discussed the importance of stopping smoking, good blood pressure, diabetes, and cholesterol control -Will recheck LDL at next visit as goal would be < 70  Since their last visit: He has had no new symptoms. His BP has been running a little high (147/88). He has not smoked since the stroke. He has stopped his asa and is on plavix  75 mg monotherapy. He continues to take pitavastatin  and zetia  for cholesterol. He had blood work drawn yesterday (lipid panel).  He was recently laid of from work, so is currently unemployed.   MEDICATIONS:  Outpatient Encounter Medications as of 12/13/2023  Medication Sig   albuterol (VENTOLIN HFA) 108 (90 Base) MCG/ACT inhaler Inhale 2 puffs into  the lungs every 6 (six) hours as needed for wheezing or shortness of breath.   amLODipine  (NORVASC ) 10 MG tablet Take 10 mg by mouth daily.   carvedilol  (COREG ) 25 MG tablet Take 1 tablet (25 mg total) by mouth 2 (two) times daily.   celecoxib  (CELEBREX ) 200 MG capsule Take 1 capsule (200 mg total) by mouth 2 (two) times daily.   clopidogrel  (PLAVIX ) 75 MG tablet Take 1 tablet (75 mg total) by mouth daily.   empagliflozin (JARDIANCE) 25 MG TABS tablet Take 25 mg by mouth daily.   ezetimibe  (ZETIA ) 10 MG tablet Take 1 tablet (10 mg total) by mouth daily.   levothyroxine (SYNTHROID) 125 MCG tablet Take 125 mcg by mouth daily.   losartan  (COZAAR ) 100 MG tablet Take 1 tablet (100 mg total) by mouth daily.   omeprazole (PRILOSEC) 40 MG capsule Take 40 mg by mouth daily.   PARoxetine  (PAXIL ) 40 MG tablet Take 40 mg by mouth daily.   Pitavastatin Calcium  (LIVALO) 4 MG TABS Take 4 mg by mouth daily.   No facility-administered encounter medications on file as of 12/13/2023.    PAST MEDICAL HISTORY: Past Medical History:  Diagnosis Date   Acute ischemic multifocal posterior circulation stroke (HCC) 08/09/2023   Anxiety    panic attack- last one several yrs. ago   Arthritis    lumbar stenosis, hnp   BMI 36.0-36.9,adult    Chest pain in adult 03/02/2016   Formatting of this note might be different from the original. Added automatically from request for surgery [id number]   CVA (cerebral vascular accident) (HCC) 08/09/2023   Depression    Depression with anxiety    DLE (discoid lupus erythematosus)    Dyslipidemia    Essential hypertension    Former smoker 11/13/2020   GERD (gastroesophageal reflux disease)    Gout, arthropathy    Hypercholesteremia    Hyperglycemia    Hypertension    ER visit for chest pain in past but pt. remarks that he was told that it was reflux    Hypokalemia 02/24/2016   Hypothyroidism    Insomnia, unspecified    Intermittent palpitations    Memory loss 01/22/2021    Mild CAD 03/23/2016   Formatting of this note might be different from the original. 35% proximal LAD   Multilevel degenerative disc disease    Obesity    Personal history of tobacco use, presenting hazards to health    PVC (premature ventricular contraction)    Shortness of breath    Sleep apnea    sleep study- results pending    Transient ischemic attack 08/08/2023   Ventricular premature depolarization 02/24/2016    PAST SURGICAL HISTORY: Past Surgical History:  Procedure Laterality Date   ANKLE ARTHROSCOPY  2003   right   APPENDECTOMY     BACK SURGERY  1996; 1998; 01/2001; 04/22/11   lumbar   BRAIN SURGERY     INGUINAL HERNIA REPAIR  2003   right side   LUMBAR LAMINECTOMY/DECOMPRESSION MICRODISCECTOMY  04/22/2011   Procedure: LUMBAR LAMINECTOMY/DECOMPRESSION MICRODISCECTOMY;  Surgeon: Catalina CHRISTELLA Stains;  Location: [facility];  Service: Neurosurgery;  Laterality: Left;  Left Lumbar five - sacral one Diskectomy  LUMBAR MICRODISCECTOMY  04/22/11   & foraminotomy; L5-S1; my 4th back surgery   NASAL SINUS SURGERY  late 1990's   /w trauma to brain during surgery ( done in [place]) transferred to [facility] & had reconstructive surgery on sinuses & brain      ALLERGIES: Allergies  Allergen Reactions   Codeine Itching   Hydrocodone Other (See Comments)    Numbness in face   Niaspan [Niacin] Other (See Comments)    Unknown reaction   Vytorin [Ezetimibe -Simvastatin] Other (See Comments)    Unknown reaction   Morphine And Codeine Itching    FAMILY HISTORY: Family History  Problem Relation Age of Onset   Diabetes Mother    Hypertension Mother    Hyperlipidemia Mother    Heart disease Mother    Arthritis Mother    Cerebrovascular Accident Father    Colon cancer Sister    Breast cancer Daughter        dx before age 52   Anesthesia problems Neg Hx    Hypotension Neg Hx    Malignant hyperthermia Neg Hx    Pseudochol deficiency Neg Hx     SOCIAL HISTORY: Social  History   Tobacco Use   Smoking status: Former    Current packs/day: 0.00    Average packs/day: 1 pack/day for 43.0 years (43.0 ttl pk-yrs)    Types: Cigarettes    Quit date: 08/08/2023    Years since quitting: 0.3   Smokeless tobacco: Current    Types: Snuff   Tobacco comments:    consult entered stopped smoking 08/08/23  Vaping Use   Vaping status: Never Used  Substance Use Topics   Alcohol use: Not Currently   Drug use: No   Social History   Social History Narrative   Are you right handed or left handed? right   Are you currently employed ?    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   Who lives with you? Wife and children   What type of home do you live in: 1 story or 2 story? one    Caffiene 2 cups and 1 soda a day      Objective:  Vital Signs:  BP (!) 147/88   Pulse 60   Resp 20   Ht 5' 8.5 (1.74 m)   Wt 207 lb (93.9 kg)   SpO2 97%   BMI 31.02 kg/m   General: No acute distress.  Patient appears well-groomed.   Head:  Normocephalic/atraumatic Neck: supple Heart: regular rate and rhythm Lungs: Clear to auscultation bilaterally. Vascular: No carotid bruits.  Neurological Exam: Mental status: alert and oriented, speech fluent and not dysarthric, language intact.  Cranial nerves: CN I: not tested CN II: pupils equal, round and reactive to light, visual fields intact CN III, IV, VI:  full range of motion, no nystagmus, no ptosis CN V: facial sensation intact. CN VII: upper and lower face symmetric CN VIII: hearing intact CN IX, X: uvula midline CN XI: sternocleidomastoid and trapezius muscles intact CN XII: tongue midline  Bulk & Tone: normal, no fasciculations. Motor:  muscle strength 5/5 throughout Deep Tendon Reflexes:  2+ throughout, except in right patella which is absent.   Sensation:  Light touch sensation intact. Finger to nose testing:  Without dysmetria.   Gait:  Normal station and stride.  Labs and  Imaging review: New results: 30 day holter (09/21/23): 30-day heart monitor results from 08/16/2023 reported average heart rate 74/min [  ranging from 52 to 186 bpm] No evidence of atrial fibrillation. No high-grade AV blocks, sustained arrhythmias or pauses.   1 run of NSVT lasting 7 beats recorded.   3 auto triggered events correlated with sinus rhythm and 1 incidence of NSVT lasting 7 beats.   No PACs or PVCs.    Previously reviewed results: 08/09/23: CBC unremarkable BMP significant for glucose 144 Lipid panel: tChol 168, LDL 83, TG 246   08/08/23: HIV negative Cardiolipin abs: IgG and IgA wnl; IgM mildly elevated (22) Beta-2 -gycloprotein wnl Lupus anticoagulant wnl Protein S activity and total elevated Protein C activity and total wnl Homocysteine wnl HbA1c: 6.8   Imaging/Procedures: MRI lumbar spine wo contrast (02/27/21): IMPRESSION: 1. L3-L4 moderate to severe spinal canal stenosis, secondary to disc bulge with superimposed central extrusion. The lateral recesses are effaced at this level, likely compressing the descending L4 nerve roots, right greater than left. There is also mild right neural foraminal narrowing at this level. 2. Postsurgical changes at L4-L5, with focal fat noted at the left aspect of the thecal sac, possibly postsurgical changes, which causes mild spinal canal stenosis superior to the L4-L5 disc space and likely compresses the descending left L4 nerve. There is also moderate to severe right and mild left neural foraminal narrowing L4-L5. 3. L5-S1 severe left and mild right neural foraminal narrowing.   MRI brain w/wo contrast (08/08/23): FINDINGS: Brain: Subcentimeter acute infarcts are present in the right and possibly left cerebellar hemispheres, right occipital lobe, posterior right hippocampus, posteromedial left temporal lobe, and right thalamocapsular junction. A chronic microhemorrhage in the right cerebellar hemisphere is unchanged from the  prior MRI. A moderate-sized region of encephalomalacia anteriorly in the left frontal lobe is again noted. Patchy T2 hyperintensities elsewhere in the cerebral white matter bilaterally are similar to the prior MRI and are nonspecific but compatible with moderate chronic small vessel ischemic disease. There are chronic lacunar infarcts in the right cerebellar hemisphere and pons. There is mild cerebral atrophy. No mass, midline shift, or extra-axial fluid collection is identified. No abnormal enhancement is identified.   Vascular: Major intracranial vascular flow voids are preserved.   Skull and upper cervical spine: Unremarkable bone marrow signal.   Sinuses/Orbits: Unremarkable orbits. Mucosal thickening in the paranasal sinuses, moderate in the left ethmoid sinus and mild elsewhere. Clear mastoid air cells.   Other: None.   IMPRESSION: 1. Subcentimeter acute infarcts in the cerebellum, right occipital lobe, right thalamocapsular junction, and posterior temporal lobes. 2. Moderate chronic small vessel ischemic disease. 3. Left frontal encephalomalacia, likely the sequelae of remote trauma.   CTA head and neck (08/08/23): IMPRESSION: CTA neck:   1. The common carotid and internal carotid arteries are patent within neck. Atherosclerotic plaque bilaterally, as described. Most notably, there is up to 60% stenosis of the proximal left internal carotid artery. 2. The non-dominant left vertebral artery is developmentally diminutive, but patent throughout the neck. 3. The dominant right vertebral artery is patent within the neck. Calcified atherosclerotic plaque within the proximal right V1 segment with no more than mild stenosis. 4. Aortic Atherosclerosis (ICD10-I70.0). 5. Nonspecific asymmetric soft tissue prominence in the region of the right palatine tonsil. A mucosal neoplasm at this site cannot be excluded. ENT referral and direct visualization recommended.   CTA head:    1. No proximal intracranial large vessel occlusion identified. 2. Intracranial atherosclerotic disease with multifocal stenoses, most notably as follows. 3. Moderate stenosis within the right vertebral artery V4 segment. 4. Severe stenosis within the  proximal left anterior inferior cerebellar artery. 5. Severe stenosis within the mid left superior cerebellar artery. 6. Severe stenosis within the paraclinoid right internal carotid artery. 7. Severe stenosis within the distal cavernous/paraclinoid left internal carotid artery.   EEG (08/08/23): IMPRESSION: This study is within normal limits. No seizures or epileptiform discharges were seen throughout the recording.   Echocardiogram (08/09/23):  1. Left ventricular ejection fraction, by estimation, is 60 to 65%. The  left ventricle has normal function. The left ventricle has no regional  wall motion abnormalities. Left ventricular diastolic parameters were  normal.   2. Right ventricular systolic function is normal. The right ventricular  size is normal.   3. The mitral valve is normal in structure. No evidence of mitral valve  regurgitation. No evidence of mitral stenosis.   4. The aortic valve is tricuspid. There is mild calcification of the  aortic valve. Aortic valve regurgitation is not visualized. Aortic valve  sclerosis/calcification is present, without any evidence of aortic  stenosis.   5. The inferior vena cava is normal in size with greater than 50%  respiratory variability, suggesting right atrial pressure of 3 mmHg.  LA normal in size.  Assessment/Plan:  This is Myers W Schoof, a 63 y.o. male with strokes. His neurological examination is essentially normal today. MRI brain on 08/08/23 showed multiple posterior circulation lacunar strokes and right basal ganglia stroke. His CTA head and neck showed stenosis in right vertebral artery, left SCA, and bilateral paraclinoid ICA. Holter monitor showed no evidence of afib. His  strokes are likely the result of small vessel disease and this known vasculopathy. His risk factors include smoking (stopped after this recent stroke), HTN, HLD, and DM.   Plan: -Will check to make sure he had lipid panel drawn yesterday. Will need to check if he has not had this done. -Continue Plavix  75 mg daily -Continue pravitastatin 4 mg daily and zetia  10 mg daily. LDL goal is < 70. -Discussed stroke warning signs  Return to clinic in as needed   Venetia Potters, MD

## 2023-12-13 ENCOUNTER — Encounter: Payer: Self-pay | Admitting: Neurology

## 2023-12-13 ENCOUNTER — Ambulatory Visit: Admitting: Neurology

## 2023-12-13 VITALS — BP 147/88 | HR 60 | Resp 20 | Ht 68.5 in | Wt 207.0 lb

## 2023-12-13 DIAGNOSIS — E119 Type 2 diabetes mellitus without complications: Secondary | ICD-10-CM | POA: Diagnosis not present

## 2023-12-13 DIAGNOSIS — Z87891 Personal history of nicotine dependence: Secondary | ICD-10-CM

## 2023-12-13 DIAGNOSIS — E785 Hyperlipidemia, unspecified: Secondary | ICD-10-CM | POA: Diagnosis not present

## 2023-12-13 DIAGNOSIS — I6381 Other cerebral infarction due to occlusion or stenosis of small artery: Secondary | ICD-10-CM

## 2023-12-13 DIAGNOSIS — I10 Essential (primary) hypertension: Secondary | ICD-10-CM | POA: Diagnosis not present

## 2023-12-13 NOTE — Patient Instructions (Addendum)
 Continue Plavix  75 mg daily  Continue pravitastatin 4 mg daily and zetia  10 mg daily.   If you have new difficulty speaking, face droop, numbness on one side of the body, weakness on one side of the body, or dizziness/imbalance, this could be the sign of a stroke. Don't wait, please call EMS and be evaluated at the nearest emergency room.  Please let me know if you have any questions or concerns in the meantime.  The physicians and staff at [facility] are committed to providing excellent care. You may receive a survey requesting feedback about your experience at our office. We strive to receive very good responses to the survey questions. If you feel that your experience would prevent you from giving the office a very good  response, please contact our office to try to remedy the situation. We may be reached at [phone]. Thank you for taking the time out of your busy day to complete the survey.  Venetia Potters, MD [facility]

## 2023-12-19 ENCOUNTER — Encounter: Payer: Self-pay | Admitting: Neurology

## 2023-12-19 ENCOUNTER — Telehealth: Payer: Self-pay | Admitting: Neurology

## 2023-12-19 NOTE — Telephone Encounter (Signed)
 Pt cld to let Dr. Leigh know Bad cholesterol was 43 and would like to know next steps

## 2023-12-20 NOTE — Telephone Encounter (Signed)
 Called office again and asked if they received Letter to send Lipid Panel results and they reported no. They gave me a new fax number and I faxed it. [phone]

## 2023-12-22 NOTE — Telephone Encounter (Signed)
 Received Lab Lipid panel  results via fax giving to Dr. Leigh.

## 2024-02-14 ENCOUNTER — Encounter: Payer: Self-pay | Admitting: Gastroenterology

## 2024-02-14 ENCOUNTER — Ambulatory Visit: Admitting: Gastroenterology

## 2024-02-14 ENCOUNTER — Telehealth: Payer: Self-pay

## 2024-02-14 VITALS — BP 122/80 | HR 69 | Ht 68.5 in | Wt 206.0 lb

## 2024-02-14 DIAGNOSIS — I63549 Cerebral infarction due to unspecified occlusion or stenosis of unspecified cerebellar artery: Secondary | ICD-10-CM

## 2024-02-14 DIAGNOSIS — Z800 Family history of malignant neoplasm of digestive organs: Secondary | ICD-10-CM | POA: Diagnosis not present

## 2024-02-14 DIAGNOSIS — R194 Change in bowel habit: Secondary | ICD-10-CM

## 2024-02-14 DIAGNOSIS — K219 Gastro-esophageal reflux disease without esophagitis: Secondary | ICD-10-CM

## 2024-02-14 MED ORDER — NA SULFATE-K SULFATE-MG SULF 17.5-3.13-1.6 GM/177ML PO SOLN: 1.0000 | ORAL | 0 refills | Status: AC

## 2024-02-14 NOTE — Telephone Encounter (Signed)
 Tried calling patient back. NA, left another message.

## 2024-02-14 NOTE — Telephone Encounter (Signed)
 Error

## 2024-02-14 NOTE — Progress Notes (Signed)
 Chief Complaint: For GI evaluation  Referring Provider:  Erick Greig LABOR, NP      ASSESSMENT AND PLAN;   #1. Change in bowel habits. Occ diarrhea   #2. FH CRC (sis at age 63, Mom at age 50). H/O polyps ar [place] 6-7 yrs ago.  #3. GERD  #4. CVA 07/2023 on plavix   Plan: -EGD/colon after cardio/neuro clearence and holding plavix  5 days prior. Can take bASA while plavix  is on hold. -Continue omeprazole 40mg  po QD   I discussed EGD/Colonoscopy- the indications, risks, alternatives and potential complications including, but not limited to bleeding, infection, reaction to meds, damage to internal organs, cardiac and/or pulmonary problems, and perforation requiring surgery. The possibility that significant findings could be missed was explained. All ? were answered. Pt consents to proceed. HPI:    Gordon Roy is a 63 y.o. male  With CVA 07/2023 on ASA/plavix , CAD, HTN, HLD, DM, OSA, COPD (quit smoking 07/2023, no home O2), anxiety, Bell's palsy History of Present Illness  He has been experiencing diarrhea for about a year, with episodes occurring three to four times a day, sometimes with urgency. There is no blood in his stool. He has a family history of colon cancer, with his sister diagnosed at age 59 and his mother at age 39 or 84. He had a colonoscopy eight years ago in [place], during which polyps were found.  He experienced a single episode of vomiting recently, which he described as feeling similar to when he first discovered he had reflux. He takes omeprazole 40 mg for reflux and his symptoms are well-controlled with medication. No nausea, abdominal pain, or difficulty swallowing.  He had a stroke in April 2025 and was recently seen by neurology in August. He is currently on Plavix  and was previously on baby aspirin , which has been discontinued.  He has a history of  mild COPD and uses inhalers occasionally. He quit smoking.  No sodas, chocolates, chewing gums, artificial  sweeteners and candy. No NSAIDs     Past GI [facility] Patients last colonoscopy was approximately 6-7 years ago and he reports he had colonic polyps, he reports one prior he also had [facility].    Patients last EGD was approximately 10 years+ with Dr. Towana in [place] to r/o Barrett's due to history of GERD, pt reports it was normal.    Past Medical History:  Diagnosis Date   Acute ischemic multifocal posterior circulation stroke (HCC) 08/09/2023   Anxiety    panic attack- last one several yrs. ago   Arthritis    lumbar stenosis, hnp   BMI 36.0-36.9,adult    Chest pain in adult 03/02/2016   Formatting of this note might be different from the original. Added automatically from request for surgery [id number]   CVA (cerebral vascular accident) (HCC) 08/09/2023   Depression    Depression with anxiety    DLE (discoid lupus erythematosus)    Dyslipidemia    Essential hypertension    Former smoker 11/13/2020   GERD (gastroesophageal reflux disease)    Gout, arthropathy    Hypercholesteremia    Hyperglycemia    Hypertension    ER visit for chest pain in past but pt. remarks that he was told that it was reflux    Hypokalemia 02/24/2016   Hypothyroidism    Insomnia, unspecified    Intermittent palpitations    Memory loss 01/22/2021   Mild CAD 03/23/2016   Formatting of this note might be different from the  original. 35% proximal LAD   Multilevel degenerative disc disease    Obesity    Personal history of tobacco use, presenting hazards to health    PVC (premature ventricular contraction)    Shortness of breath    Sleep apnea    sleep study- results pending    Transient ischemic attack 08/08/2023   Ventricular premature depolarization 02/24/2016    Past Surgical History:  Procedure Laterality Date   ANKLE ARTHROSCOPY  2003   right   APPENDECTOMY     BACK SURGERY  1996; 1998; 01/2001; 04/22/11   lumbar   BRAIN SURGERY     INGUINAL HERNIA REPAIR  2003   right side    LUMBAR LAMINECTOMY/DECOMPRESSION MICRODISCECTOMY  04/22/2011   Procedure: LUMBAR LAMINECTOMY/DECOMPRESSION MICRODISCECTOMY;  Surgeon: Catalina CHRISTELLA Stains;  Location: [facility];  Service: Neurosurgery;  Laterality: Left;  Left Lumbar five - sacral one Diskectomy   LUMBAR MICRODISCECTOMY  04/22/11   & foraminotomy; L5-S1; my 4th back surgery   NASAL SINUS SURGERY  late 1990's   /w trauma to brain during surgery ( done in [place]) transferred to [facility] & had reconstructive surgery on sinuses & brain      Family History  Problem Relation Age of Onset   Diabetes Mother    Hypertension Mother    Hyperlipidemia Mother    Heart disease Mother    Arthritis Mother    Cerebrovascular Accident Father    Colon cancer Sister    Breast cancer Daughter        dx before age 42   Anesthesia problems Neg Hx    Hypotension Neg Hx    Malignant hyperthermia Neg Hx    Pseudochol deficiency Neg Hx     Social History   Tobacco Use   Smoking status: Former    Current packs/day: 0.00    Average packs/day: 1 pack/day for 43.0 years (43.0 ttl pk-yrs)    Types: Cigarettes    Quit date: 08/08/2023    Years since quitting: 0.5   Smokeless tobacco: Current    Types: Snuff   Tobacco comments:    consult entered stopped smoking 08/08/23  Vaping Use   Vaping status: Never Used  Substance Use Topics   Alcohol use: Not Currently   Drug use: No    Current Outpatient Medications  Medication Sig Dispense Refill   albuterol (VENTOLIN HFA) 108 (90 Base) MCG/ACT inhaler Inhale 2 puffs into the lungs every 6 (six) hours as needed for wheezing or shortness of breath.     amLODipine  (NORVASC ) 10 MG tablet Take 10 mg by mouth daily.     carvedilol  (COREG ) 25 MG tablet Take 1 tablet (25 mg total) by mouth 2 (two) times daily. 180 tablet 3   celecoxib  (CELEBREX ) 200 MG capsule Take 1 capsule (200 mg total) by mouth 2 (two) times daily. 180 capsule 3   clopidogrel  (PLAVIX ) 75 MG tablet Take 1 tablet (75 mg total)  by mouth daily. 30 tablet 11   empagliflozin (JARDIANCE) 25 MG TABS tablet Take 25 mg by mouth daily.     ezetimibe  (ZETIA ) 10 MG tablet Take 1 tablet (10 mg total) by mouth daily. 90 tablet 3   levothyroxine (SYNTHROID) 125 MCG tablet Take 125 mcg by mouth daily.     losartan  (COZAAR ) 100 MG tablet Take 1 tablet (100 mg total) by mouth daily. 90 tablet 3   omeprazole (PRILOSEC) 40 MG capsule Take 40 mg by mouth daily.     PARoxetine  (PAXIL )  40 MG tablet Take 40 mg by mouth daily.     Pitavastatin Calcium  (LIVALO) 4 MG TABS Take 4 mg by mouth daily.     No current facility-administered medications for this visit.    Allergies  Allergen Reactions   Codeine Itching   Hydrocodone Other (See Comments)    Numbness in face   Niaspan [Niacin] Other (See Comments)    Unknown reaction   Vytorin [Ezetimibe -Simvastatin] Other (See Comments)    Unknown reaction   Morphine And Codeine Itching    Review of Systems:  Constitutional: Denies fever, chills, diaphoresis, appetite change and fatigue.  HEENT: neg Respiratory: Denies SOB, DOE, cough, chest tightness,  and wheezing.   Cardiovascular: Denies chest pain, palpitations and leg swelling.  Genitourinary: Denies dysuria, urgency, frequency, hematuria, flank pain and difficulty urinating.  Musculoskeletal: Denies myalgias, back pain, joint swelling, arthralgias and gait problem.  Skin: No rash.  Neurological: Denies dizziness, seizures, syncope, weakness, light-headedness, numbness and headaches.  Hematological: Denies adenopathy. Easy bruising, personal or family bleeding history  Psychiatric/Behavioral: No anxiety or depression     Physical Exam:    BP 122/80   Pulse 69   Ht 5' 8.5 (1.74 m)   Wt 206 lb (93.4 kg)   BMI 30.87 kg/m  Wt Readings from Last 3 Encounters:  02/14/24 206 lb (93.4 kg)  12/13/23 207 lb (93.9 kg)  11/17/23 202 lb 3.2 oz (91.7 kg)   Constitutional:  Well-developed, in no acute distress. Psychiatric:  Normal mood and affect. Behavior is normal. HEENT: Pupils normal.  Conjunctivae are normal. No scleral icterus. Cardiovascular: Normal rate, regular rhythm. No edema Pulmonary/chest: Effort normal and breath sounds normal. No wheezing, rales or rhonchi. Abdominal: Soft, nondistended. Nontender. Bowel sounds active throughout. There are no masses palpable. No hepatomegaly. Rectal: Deferred Neurological: Alert and oriented to person place and time. Skin: Skin is warm and dry. No rashes noted.  Data Reviewed: I have personally reviewed following labs and imaging studies  CBC:    Latest Ref Rng & Units 08/09/2023    6:10 AM 08/08/2023   10:12 AM 08/08/2023   10:10 AM  CBC  WBC 4.0 - 10.5 K/uL 8.8   12.2   Hemoglobin 13.0 - 17.0 g/dL 83.9  82.6  82.5   Hematocrit 39.0 - 52.0 % 46.5  51.0  50.6   Platelets 150 - 400 K/uL 233   264     CMP:    Latest Ref Rng & Units 08/09/2023    6:10 AM 08/08/2023   10:12 AM 08/08/2023   10:10 AM  CMP  Glucose 70 - 99 mg/dL 855  818  817   BUN 8 - 23 mg/dL 15  13  12    Creatinine 0.61 - 1.24 mg/dL 9.11  9.19  9.17   Sodium 135 - 145 mmol/L 141  139  137   Potassium 3.5 - 5.1 mmol/L 3.4  3.7  3.6   Chloride 98 - 111 mmol/L 106  103  103   CO2 22 - 32 mmol/L 23   23   Calcium  8.9 - 10.3 mg/dL 9.2   9.3   Total Protein 6.5 - 8.1 g/dL   7.9   Total Bilirubin 0.0 - 1.2 mg/dL   0.8   Alkaline Phos 38 - 126 U/L   80   AST 15 - 41 U/L   20   ALT 0 - 44 U/L   21        Anselm Bring, MD 02/14/2024,  9:52 AM  Cc: Erick Greig LABOR, NP

## 2024-02-14 NOTE — Telephone Encounter (Signed)
 Patient was returning call. Please advise ?

## 2024-02-14 NOTE — Telephone Encounter (Signed)
 Called patient regarding scheduling tele-visit for cardiac clearance. NA, left message for patient to contact our office.

## 2024-02-14 NOTE — Telephone Encounter (Signed)
 [facility] Pre-operative Risk Assessment     Request for surgical clearance:     Endoscopy Procedure  What type of surgery is being performed?     Endoscopy/Colonoscopy  When is this surgery scheduled?     03-28-24  What type of clearance is required ?   Pharmacy and Cardiac Clearance  Are there any medications that need to be held prior to surgery and how long? Plavix  x 5 days  Practice name and name of physician performing surgery?      [facility]  What is your office phone and fax number?      Phone- [phone]  Fax- [phone]  Anesthesia type (None, local, MAC, general) ?       MAC   Please route your response to Nat SAUNDERS, CMA

## 2024-02-14 NOTE — Patient Instructions (Signed)
 _______________________________________________________  If your blood pressure at your visit was 140/90 or greater, please contact your primary care physician to follow up on this.  _______________________________________________________  If you are age 61 or older, your body mass index should be between 23-30. Your Body mass index is 30.87 kg/m. If this is out of the aforementioned range listed, please consider follow up with your Primary Care Provider.  If you are age 62 or younger, your body mass index should be between 19-25. Your Body mass index is 30.87 kg/m. If this is out of the aformentioned range listed, please consider follow up with your Primary Care Provider.   ________________________________________________________  The  GI providers would like to encourage you to use MYCHART to communicate with providers for non-urgent requests or questions.  Due to long hold times on the telephone, sending your provider a message by [facility] may be a faster and more efficient way to get a response.  Please allow 48 business hours for a response.  Please remember that this is for non-urgent requests.  _______________________________________________________  [facility] is using a team-based approach to care.  Your team is made up of your doctor and two to three APPS. Our APPS (Nurse Practitioners and Physician Assistants) work with your physician to ensure care continuity for you. They are fully qualified to address your health concerns and develop a treatment plan. They communicate directly with your gastroenterologist to care for you. Seeing the Advanced Practice Practitioners on your physician's team can help you by facilitating care more promptly, often allowing for earlier appointments, access to diagnostic testing, procedures, and other specialty referrals.   CONTINUE omeprazole 40mg  one daily  You have been scheduled for an endoscopy and colonoscopy. Please follow the  written instructions given to you at your visit today.  If you use inhalers (even only as needed), please bring them with you on the day of your procedure.  DO NOT TAKE 7 DAYS PRIOR TO TEST- Trulicity (dulaglutide) Ozempic, Wegovy (semaglutide) Mounjaro (tirzepatide) Bydureon Bcise (exanatide extended release)  DO NOT TAKE 1 DAY PRIOR TO YOUR TEST Rybelsus (semaglutide) Adlyxin (lixisenatide) Victoza (liraglutide) Byetta (exanatide) ___________________________________________________________________________  Due to recent changes in healthcare laws, you may see the results of your imaging and laboratory studies on MyChart before your provider has had a chance to review them.  We understand that in some cases there may be results that are confusing or concerning to you. Not all laboratory results come back in the same time frame and the provider may be waiting for multiple results in order to interpret others.  Please give us  48 hours in order for your provider to thoroughly review all the results before contacting the office for clarification of your results.   Thank you for entrusting me with your care and choosing [facility].  Dr Charlanne

## 2024-02-14 NOTE — Telephone Encounter (Signed)
   Name: Gordon Roy  DOB: June 15, 1960  MRN: [id number]  Primary Cardiologist: None   Preoperative team, please contact this patient and set up a phone call appointment for further preoperative risk assessment. Please obtain consent and complete medication review. Thank you for your help.  I confirm that guidance regarding antiplatelet and oral anticoagulation therapy has been completed and, if necessary, noted below.  Per office protocol, if patient is without any new symptoms or concerns at the time of their virtual visit, he may hold Plavix  for 5 days prior to procedure. Please resume Plavix  as soon as possible postprocedure, at the discretion of the surgeon.    I also confirmed the patient resides in the state of [place] . As per [facility] Medical Board telemedicine laws, the patient must reside in the state in which the provider is licensed.   Lamarr Satterfield, NP 02/14/2024, 2:50 PM [facility]

## 2024-02-15 NOTE — Telephone Encounter (Signed)
 Left the patient a message in order to schedule pre-op clearance.

## 2024-02-16 ENCOUNTER — Telehealth: Payer: Self-pay

## 2024-02-16 NOTE — Telephone Encounter (Signed)
 Procedure for 03-28-24 has been canceled due to hx of stroke in April 2025.  Our office has made patient aware that he will not longer need cardiac clearance or permission to hold Plavix  as his procedure will not be scheduled until April 2026.  Patient will no longer need teleheath visit scheduled for 03-19-24.  We will reach out to cardiology closer to the procedures being scheduled in April 2026.  Patient agreed to plan and verbalized understanding.

## 2024-02-16 NOTE — Telephone Encounter (Signed)
 Leigh Venetia CROME, MD  Benjamine Nat DEL, CMA Patient had a stroke that appears to be from small vessel disease, in 07/2023. He is currently on Plavix  75 mg daily in terms of antiplatelet therapy for secondary stroke prevention. If this can be continued for endoscopy and colonoscopy safely, then I would recommend not stopping it. If it must be stopped, I would stop for the minimum time necessary and restart as soon as able. There is obviously an increased risk of stroke if patient were to stop Plavix , but this is a small risk if done for the shortest amount of time possible. A risk vs benefit discussion with patient is always advised as I know you always do with informed consent. If patient and GI team feels the benefits outweigh the risks, then there is no strict contraindications from a neurologic standpoint.  Please let me know if you have further questions or concerns.  Venetia Leigh, MD [facility]  Charlanne Groom, MD  Benjamine Nat DEL, CMA Lets hold off on EGD/colon until April 2026 RG     Patient aware that [facility] scheduled for 03-28-24 with Dr Charlanne has been canceled until April 2026.  Our office will obtain cardiac and neurology clearance at that time.  Patient agreed to plan and verbalized understanding.  No further questions.

## 2024-02-16 NOTE — Telephone Encounter (Signed)
 Patient has been scheduled for TELEVISIT

## 2024-02-16 NOTE — Progress Notes (Signed)
 Patient advised that Endo/Colon for 03-28-24 has been canceled.  Patient aware that we will contact him after April 2026 to get procedures scheduled.  He will need cardiac and neurology clearance.  Patient agreed to plan and verbalized understanding.  No further questions.

## 2024-02-16 NOTE — Telephone Encounter (Signed)
 Patient has been scheduled for TELEVISIT and consent is done     Patient Consent for Virtual Visit         Gordon Roy has provided verbal consent on 02/16/2024 for a virtual visit (video or telephone).   CONSENT FOR VIRTUAL VISIT FOR:  Gordon Roy  By participating in this virtual visit I agree to the following:  I hereby voluntarily request, consent and authorize [facility] and its employed or contracted physicians, physician assistants, nurse practitioners or other licensed health care professionals (the Practitioner), to provide me with telemedicine health care services (the "Services) as deemed necessary by the treating Practitioner. I acknowledge and consent to receive the Services by the Practitioner via telemedicine. I understand that the telemedicine visit will involve communicating with the Practitioner through live audiovisual communication technology and the disclosure of certain medical information by electronic transmission. I acknowledge that I have been given the opportunity to request an in-person assessment or other available alternative prior to the telemedicine visit and am voluntarily participating in the telemedicine visit.  I understand that I have the right to withhold or withdraw my consent to the use of telemedicine in the course of my care at any time, without affecting my right to future care or treatment, and that the Practitioner or I may terminate the telemedicine visit at any time. I understand that I have the right to inspect all information obtained and/or recorded in the course of the telemedicine visit and may receive copies of available information for a reasonable fee.  I understand that some of the potential risks of receiving the Services via telemedicine include:  Delay or interruption in medical evaluation due to technological equipment failure or disruption; Information transmitted may not be sufficient (e.g. poor resolution of images) to  allow for appropriate medical decision making by the Practitioner; and/or  In rare instances, security protocols could fail, causing a breach of personal health information.  Furthermore, I acknowledge that it is my responsibility to provide information about my medical history, conditions and care that is complete and accurate to the best of my ability. I acknowledge that Practitioner's advice, recommendations, and/or decision may be based on factors not within their control, such as incomplete or inaccurate data provided by me or distortions of diagnostic images or specimens that may result from electronic transmissions. I understand that the practice of medicine is not an exact science and that Practitioner makes no warranties or guarantees regarding treatment outcomes. I acknowledge that a copy of this consent can be made available to me via my patient portal [facility] MyChart), or I can request a printed copy by calling the office of [facility].    I understand that my insurance will be billed for this visit.   I have read or had this consent read to me. I understand the contents of this consent, which adequately explains the benefits and risks of the Services being provided via telemedicine.  I have been provided ample opportunity to ask questions regarding this consent and the Services and have had my questions answered to my satisfaction. I give my informed consent for the services to be provided through the use of telemedicine in my medical care

## 2024-03-19 ENCOUNTER — Ambulatory Visit (INDEPENDENT_AMBULATORY_CARE_PROVIDER_SITE_OTHER): Admitting: Cardiology

## 2024-03-19 DIAGNOSIS — Z01818 Encounter for other preprocedural examination: Secondary | ICD-10-CM

## 2024-03-19 NOTE — Progress Notes (Signed)
   Patient Name: Gordon Roy  DOB: Nov 21, 1960 MRN: [id number]  Primary Cardiologist: Alean SAUNDERS Madireddy, MD  Chart reviewed as part of pre-operative protocol coverage.   Patient was scheduled for preoperative televisit, patient was called on 3 separate occasions with callback numbers provided.   Patient was unable to be contacted patient will need to reschedule preoperative televisit.   Gordon JAYSON Hoover, NP 03/19/2024, 10:52 AM

## 2024-03-28 ENCOUNTER — Encounter: Admitting: Gastroenterology

## 2024-05-10 ENCOUNTER — Other Ambulatory Visit: Payer: Self-pay | Admitting: Neurosurgery

## 2024-05-10 DIAGNOSIS — M48062 Spinal stenosis, lumbar region with neurogenic claudication: Secondary | ICD-10-CM

## 2024-05-14 ENCOUNTER — Encounter: Payer: Self-pay | Admitting: Neurosurgery

## 2024-05-18 ENCOUNTER — Ambulatory Visit
Admission: RE | Admit: 2024-05-18 | Discharge: 2024-05-18 | Disposition: A | Source: Ambulatory Visit | Attending: Neurosurgery

## 2024-05-18 DIAGNOSIS — M48062 Spinal stenosis, lumbar region with neurogenic claudication: Secondary | ICD-10-CM
